# Patient Record
Sex: Male | Born: 1965
Health system: Southern US, Community
[De-identification: ages and names within clinical notes are randomized; demographics above are authoritative.]

## PROBLEM LIST (undated history)

## (undated) DIAGNOSIS — K649 Unspecified hemorrhoids: Secondary | ICD-10-CM

## (undated) DIAGNOSIS — G8929 Other chronic pain: Secondary | ICD-10-CM

## (undated) DIAGNOSIS — M545 Low back pain, unspecified: Secondary | ICD-10-CM

## (undated) DIAGNOSIS — J309 Allergic rhinitis, unspecified: Secondary | ICD-10-CM

## (undated) DIAGNOSIS — Z8614 Personal history of Methicillin resistant Staphylococcus aureus infection: Secondary | ICD-10-CM

## (undated) DIAGNOSIS — K625 Hemorrhage of anus and rectum: Secondary | ICD-10-CM

## (undated) DIAGNOSIS — G4733 Obstructive sleep apnea (adult) (pediatric): Secondary | ICD-10-CM

## (undated) DIAGNOSIS — F419 Anxiety disorder, unspecified: Secondary | ICD-10-CM

## (undated) DIAGNOSIS — J329 Chronic sinusitis, unspecified: Secondary | ICD-10-CM

## (undated) HISTORY — PX: VASECTOMY: SHX75

## (undated) HISTORY — DX: Hemorrhage of anus and rectum: K62.5

## (undated) HISTORY — DX: Obstructive sleep apnea (adult) (pediatric): G47.33

## (undated) HISTORY — PX: COLONOSCOPY: SHX174

## (undated) HISTORY — PX: MOLE REMOVAL: SHX2046

## (undated) HISTORY — DX: Low back pain, unspecified: M54.50

## (undated) HISTORY — DX: Allergic rhinitis, unspecified: J30.9

## (undated) HISTORY — DX: Low back pain: M54.5

## (undated) HISTORY — DX: Chronic sinusitis, unspecified: J32.9

## (undated) HISTORY — DX: Anxiety disorder, unspecified: F41.9

## (undated) HISTORY — DX: Other chronic pain: G89.29

## (undated) HISTORY — DX: Personal history of Methicillin resistant Staphylococcus aureus infection: Z86.14

---

## 2013-05-30 ENCOUNTER — Encounter: Payer: Self-pay | Admitting: Cardiology

## 2013-05-30 ENCOUNTER — Encounter: Payer: Self-pay | Admitting: *Deleted

## 2013-06-07 ENCOUNTER — Encounter: Payer: Self-pay | Admitting: Cardiology

## 2013-06-07 ENCOUNTER — Ambulatory Visit (INDEPENDENT_AMBULATORY_CARE_PROVIDER_SITE_OTHER): Payer: BC Managed Care – PPO | Admitting: Cardiology

## 2013-06-07 VITALS — BP 140/80 | HR 68 | Ht 66.0 in | Wt 199.0 lb

## 2013-06-07 DIAGNOSIS — R5381 Other malaise: Secondary | ICD-10-CM

## 2013-06-07 DIAGNOSIS — R635 Abnormal weight gain: Secondary | ICD-10-CM

## 2013-06-07 DIAGNOSIS — G4733 Obstructive sleep apnea (adult) (pediatric): Secondary | ICD-10-CM | POA: Insufficient documentation

## 2013-06-07 DIAGNOSIS — E669 Obesity, unspecified: Secondary | ICD-10-CM | POA: Insufficient documentation

## 2013-06-07 DIAGNOSIS — R5383 Other fatigue: Secondary | ICD-10-CM

## 2013-06-07 LAB — CBC
RBC: 4.88 Mil/uL (ref 4.22–5.81)
RDW: 13.7 % (ref 11.5–14.6)
WBC: 6.3 10*3/uL (ref 4.5–10.5)

## 2013-06-07 NOTE — Patient Instructions (Signed)
Your physician recommends that you continue on your current medications as directed. Please refer to the Current Medication list given to you today.  Your physician has requested that you have an exercise tolerance test. For further information please visit [email or web address] Please also follow instruction sheet, as given.   Please go to the lab and have your CBC and TSH drawn  Your physician recommends that you schedule a follow-up appointment in: 6 Months with Dr. Mayford Knife

## 2013-06-07 NOTE — Progress Notes (Signed)
  [address] [place], [place]  [address] Phone: [phone] Fax:  [phone]  Date:  06/07/2013   ID:  Steven Kirby, DOB 04-Jun-1966, MRN [id number]  PCP:  No primary provider on file.  Sleep Medicine MD:  Armanda Magic, MD     History of Present Illness: Steven Kirby is a 47 y.o. male with a history of OSA and obesity who presents for followup today.  He tolerates his CPAP but he takes it off in his sleep.  He uses a full face mask.  Over the past 3-4 months he has felt weak and has had increased weight gain.  He has noticed increased SOB but no chest pain.  He occasionally he notices some racing of his heart but it is usually when he is doing exertional activity.  He does not feel rested when he gets up in the am.  He does not fall asleep during the day but sometimes has a hard time staying awake during the day.  Wt Readings from Last 3 Encounters:  No data found for Wt     Past Medical History  Diagnosis Date  . Allergic rhinitis   . OSA (obstructive sleep apnea)   . Hemorrhage of rectum and anus   . History of MRSA infection   . Anxiety   . Chronic sinusitis   . Chronic low back pain   . Anxiety     No current outpatient prescriptions on file.   No current facility-administered medications for this visit.    Allergies:   Not on File  Social History:  The patient  reports that he has never smoked. He does not have any smokeless tobacco history on file. He reports that he does not drink alcohol or use illicit drugs.   Family History:  The patient's family history includes Colon cancer in an other family member; Diverticulitis in an other family member; Hyperlipidemia in an other family member; Hypertension in an other family member.   ROS:  Please see the history of present illness.      All other systems reviewed and negative.   PHYSICAL EXAM: VS:  There were no vitals taken for this visit. Well nourished, well developed, in no acute distress HEENT:  normal Neck: no JVD Cardiac:  normal S1, S2; RRR; no murmur Lungs:  clear to auscultation bilaterally, no wheezing, rhonchi or rales Abd: soft, nontender, no hepatomegaly Ext: no edema Skin: warm and dry Neuro:  CNs 2-12 intact, no focal abnormalities noted      ASSESSMENT AND PLAN:  1. Obstructive sleep apnea on CPAP therapy which he tolerates well but is having some problems keeping the mask on.  He is having some daytime sleepiness and nonrestorative sleep.  - I will get a CPAP download from his DME to see if his AHI is elevated  2. Obesity  - encouraged patient to continue with his exercise and diet 3.  Weight gain associated with fatigue ? Thyroid issues  - check TSH/CBC  - I have recommended an ETT to rule out ischemia due to weakness before starting exercise program 4.  Obesity - he tries to walk 15-30 minutes 3 times weekly.  He eats fast food for lunch.    - I have encouraged him to increase his walking to 45-60 minutes daily if stress test is normal  and consider weight watchers   Followup in 6 months  Signed, Armanda Magic, MD 06/07/2013 1:30 PM

## 2013-06-09 ENCOUNTER — Telehealth: Payer: Self-pay | Admitting: General Surgery

## 2013-06-09 NOTE — Telephone Encounter (Signed)
Message copied by Nita Sells on Wed Jun 09, 2013  9:36 AM ------      Message from: Armanda Magic R      Created: Tue Jun 08, 2013  2:10 PM       Please let patient know that labs were normal.  Continue current medical therapy. ------

## 2013-06-09 NOTE — Telephone Encounter (Signed)
PT is aware of the results.  

## 2013-07-08 ENCOUNTER — Encounter: Payer: BC Managed Care – PPO | Admitting: Cardiology

## 2015-03-10 ENCOUNTER — Emergency Department (HOSPITAL_COMMUNITY)
Admission: EM | Admit: 2015-03-10 | Discharge: 2015-03-10 | Disposition: A | Discharge status: Home / Self Care | Payer: BLUE CROSS/BLUE SHIELD | Attending: Emergency Medicine | Admitting: Emergency Medicine

## 2015-03-10 ENCOUNTER — Encounter (HOSPITAL_COMMUNITY): Payer: Self-pay | Admitting: Vascular Surgery

## 2015-03-10 ENCOUNTER — Emergency Department (HOSPITAL_COMMUNITY): Payer: BLUE CROSS/BLUE SHIELD

## 2015-03-10 DIAGNOSIS — Z8719 Personal history of other diseases of the digestive system: Secondary | ICD-10-CM | POA: Insufficient documentation

## 2015-03-10 DIAGNOSIS — G8929 Other chronic pain: Secondary | ICD-10-CM | POA: Insufficient documentation

## 2015-03-10 DIAGNOSIS — Z8659 Personal history of other mental and behavioral disorders: Secondary | ICD-10-CM | POA: Diagnosis not present

## 2015-03-10 DIAGNOSIS — Z8614 Personal history of Methicillin resistant Staphylococcus aureus infection: Secondary | ICD-10-CM | POA: Diagnosis not present

## 2015-03-10 DIAGNOSIS — M6281 Muscle weakness (generalized): Secondary | ICD-10-CM | POA: Insufficient documentation

## 2015-03-10 DIAGNOSIS — R269 Unspecified abnormalities of gait and mobility: Secondary | ICD-10-CM | POA: Diagnosis not present

## 2015-03-10 DIAGNOSIS — M545 Low back pain: Secondary | ICD-10-CM | POA: Diagnosis present

## 2015-03-10 DIAGNOSIS — M5441 Lumbago with sciatica, right side: Secondary | ICD-10-CM | POA: Insufficient documentation

## 2015-03-10 DIAGNOSIS — R29898 Other symptoms and signs involving the musculoskeletal system: Secondary | ICD-10-CM

## 2015-03-10 HISTORY — DX: Unspecified hemorrhoids: K64.9

## 2015-03-10 LAB — CBC WITH DIFFERENTIAL/PLATELET
BASOS ABS: 0 10*3/uL (ref 0.0–0.1)
BASOS PCT: 0 % (ref 0–1)
EOS ABS: 0 10*3/uL (ref 0.0–0.7)
EOS PCT: 0 % (ref 0–5)
HEMATOCRIT: 41.8 % (ref 39.0–52.0)
Hemoglobin: 14.1 g/dL (ref 13.0–17.0)
Lymphocytes Relative: 5 % — ABNORMAL LOW (ref 12–46)
Lymphs Abs: 0.9 10*3/uL (ref 0.7–4.0)
MCH: 28 pg (ref 26.0–34.0)
MCHC: 33.7 g/dL (ref 30.0–36.0)
MCV: 82.9 fL (ref 78.0–100.0)
Monocytes Absolute: 0.5 10*3/uL (ref 0.1–1.0)
Monocytes Relative: 3 % (ref 3–12)
Neutro Abs: 15.5 10*3/uL — ABNORMAL HIGH (ref 1.7–7.7)
Neutrophils Relative %: 92 % — ABNORMAL HIGH (ref 43–77)
Platelets: 272 10*3/uL (ref 150–400)
RBC: 5.04 MIL/uL (ref 4.22–5.81)
RDW: 12.8 % (ref 11.5–15.5)
WBC: 16.9 10*3/uL — ABNORMAL HIGH (ref 4.0–10.5)

## 2015-03-10 LAB — BASIC METABOLIC PANEL
ANION GAP: 8 (ref 5–15)
BUN: 14 mg/dL (ref 6–20)
CO2: 26 mmol/L (ref 22–32)
Calcium: 9.5 mg/dL (ref 8.9–10.3)
Chloride: 104 mmol/L (ref 101–111)
Creatinine, Ser: 1.12 mg/dL (ref 0.61–1.24)
GFR calc non Af Amer: >60 mL/min (ref >60)
Glucose, Bld: 156 mg/dL — ABNORMAL HIGH (ref 65–99)
Potassium: 4.1 mmol/L (ref 3.5–5.1)
Sodium: 138 mmol/L (ref 135–145)

## 2015-03-10 MED ORDER — SODIUM CHLORIDE 0.9 % IV BOLUS (SEPSIS)
1000.0000 mL | Freq: Once | INTRAVENOUS | Status: AC
  Administered 2015-03-10: 1000 mL via INTRAVENOUS

## 2015-03-10 MED ORDER — METHOCARBAMOL 500 MG PO TABS
1000.0000 mg | ORAL_TABLET | Freq: Once | ORAL | Status: AC
  Administered 2015-03-10: 1000 mg via ORAL
  Filled 2015-03-10: qty 2

## 2015-03-10 MED ORDER — HYDROMORPHONE HCL 1 MG/ML IJ SOLN
1.0000 mg | Freq: Once | INTRAMUSCULAR | Status: AC
  Administered 2015-03-10: 1 mg via INTRAVENOUS
  Filled 2015-03-10: qty 1

## 2015-03-10 MED ORDER — METHOCARBAMOL 500 MG PO TABS: 1000.0000 mg | ORAL_TABLET | Freq: Two times a day (BID) | ORAL | Status: DC

## 2015-03-10 MED ORDER — NAPROXEN 500 MG PO TABS: 500.0000 mg | ORAL_TABLET | Freq: Two times a day (BID) | ORAL | Status: DC

## 2015-03-10 MED ORDER — DEXAMETHASONE SODIUM PHOSPHATE 10 MG/ML IJ SOLN
10.0000 mg | Freq: Once | INTRAMUSCULAR | Status: AC
  Administered 2015-03-10: 10 mg via INTRAVENOUS
  Filled 2015-03-10: qty 1

## 2015-03-10 MED ORDER — OXYCODONE-ACETAMINOPHEN 5-325 MG PO TABS
2.0000 | ORAL_TABLET | Freq: Once | ORAL | Status: AC
Start: 2015-03-10 — End: 2015-03-10
  Administered 2015-03-10: 2 via ORAL
  Filled 2015-03-10: qty 2

## 2015-03-10 MED ORDER — OXYCODONE-ACETAMINOPHEN 7.5-325 MG PO TABS: 1.0000 | ORAL_TABLET | ORAL | Status: DC | PRN

## 2015-03-10 MED ORDER — DIAZEPAM 5 MG/ML IJ SOLN
5.0000 mg | Freq: Once | INTRAMUSCULAR | Status: AC
  Administered 2015-03-10: 5 mg via INTRAVENOUS
  Filled 2015-03-10: qty 2

## 2015-03-10 NOTE — ED Notes (Signed)
Pt reports to the ED for eval low back pain that radiates into his right leg. He has had this pain for several days since he lifted a 50-60 lb log on Sunday 03/05/15. He went to go see his PCP and was given Norco and Prednisone and told to have an MRI. HE went for an MRI today but was unable to sit at the imaging center. He had 1 Norco approx 15 mins ago. He reports he has had increased difficulty walking as well. Denies any tingling, paralysis, or bowel or bladder changes but does report some right leg numbness. Pt A&Ox4, resp e/u, and skin warm and dry.

## 2015-03-10 NOTE — ED Provider Notes (Signed)
Care assumed from Dr. Reather Converse.  Steven Kirby is a 49 y.o. male presents from outpatient imaging center for worsening back pain that radiates down the right leg for several days after lifting a heavy load on 03/05/15.  He has been taking norco and prednisone without relief.  On initial exam by Dr. Reather Converse. pt with mild weakness of flexion of the right hip.  He is without saddle anesthesia, loss of bowel or bladder control.     Physical Exam  BP 112/70 mmHg  Pulse 81  Temp(Src) 98.7 F (37.1 C) (Oral)  Resp 18  Ht 5\' 6"  (1.676 m)  Wt 200 lb (90.719 kg)  BMI 32.30 kg/m2  SpO2 97%  Physical Exam   Face to face Exam:   General: Awake  HEENT: Atraumatic  Resp: Normal effort  Abd: Nondistended  Neuro: Mild weakness with flexion of the right hip; 3/5 in the right hip  Results for orders placed or performed during the hospital encounter of 03/10/15  CBC with Differential/Platelet  Result Value Ref Range   WBC 16.9 (H) 4.0 - 10.5 K/uL   RBC 5.04 4.22 - 5.81 MIL/uL   Hemoglobin 14.1 13.0 - 17.0 g/dL   HCT 41.8 39.0 - 52.0 %   MCV 82.9 78.0 - 100.0 fL   MCH 28.0 26.0 - 34.0 pg   MCHC 33.7 30.0 - 36.0 g/dL   RDW 12.8 11.5 - 15.5 %   Platelets 272 150 - 400 K/uL   Neutrophils Relative % 92 (H) 43 - 77 %   Neutro Abs 15.5 (H) 1.7 - 7.7 K/uL   Lymphocytes Relative 5 (L) 12 - 46 %   Lymphs Abs 0.9 0.7 - 4.0 K/uL   Monocytes Relative 3 3 - 12 %   Monocytes Absolute 0.5 0.1 - 1.0 K/uL   Eosinophils Relative 0 0 - 5 %   Eosinophils Absolute 0.0 0.0 - 0.7 K/uL   Basophils Relative 0 0 - 1 %   Basophils Absolute 0.0 0.0 - 0.1 K/uL  Basic metabolic panel  Result Value Ref Range   Sodium 138 135 - 145 mmol/L   Potassium 4.1 3.5 - 5.1 mmol/L   Chloride 104 101 - 111 mmol/L   CO2 26 22 - 32 mmol/L   Glucose, Bld 156 (H) 65 - 99 mg/dL   BUN 14 6 - 20 mg/dL   Creatinine, Ser 1.12 0.61 - 1.24 mg/dL   Calcium 9.5 8.9 - 10.3 mg/dL   GFR calc non Af Amer >60 >60 mL/min   GFR calc Af Amer >60  >60 mL/min   Anion gap 8 5 - 15   Mr Thoracic Spine Wo Contrast  03/10/2015   : CLINICAL DATA: Right leg weakness. Low back pain  EXAM: MRI THORACIC AND LUMBAR SPINE WITHOUT CONTRAST  TECHNIQUE: Multiplanar and multiecho pulse sequences of the thoracic and lumbar spine were obtained without intravenous contrast.  COMPARISON: None.  FINDINGS: MR THORACIC SPINE FINDINGS  Negative for thoracic spine fracture or mass. No bone marrow edema. Spinal cord signal normal. No cord compression or cord mass.  Thoracic disc degeneration. Abnormal disc spaces are described below  T5-6: Small right paracentral disc protrusion without stenosis  T6-7: Mild disc degeneration without stenosis. Small right-sided disc protrusion.  T10-11: Small to moderate right-sided disc protrusion with mild flattening of the cord. No cord compression  T11-12: Small central disc protrusion without stenosis.  MR LUMBAR SPINE FINDINGS  Normal lumbar alignment. Negative for fracture or mass lesion. Conus medullaris  normal and terminates at mid L1  L1-2: Small right foraminal disc protrusion. No compression of the right L1 nerve root. No significant spinal stenosis  L2-3: Mild disc degeneration and diffuse bulging of the disc without significant spinal stenosis.  L3-4: Moderate disc degeneration. Fatty changes in the endplates right-greater-than-left with associated disc space narrowing. Small right lateral disc protrusion with displacement of the right L3 nerve root. Mild facet degeneration. Mild narrowing of the canal.  L4-5: Moderately large right lateral disc protrusion with potential for compression of the right L4 nerve root in the foramen and lateral to the foramen. Mild facet degeneration without spinal stenosis  L5-S1: Small central disc protrusion without neural impingement  IMPRESSION: MR THORACIC SPINE IMPRESSION  Small central disc protrusions T5-6 and T6-7.  Small moderate right-sided disc protrusion T10-11  Small central disc protrusion  T11-12  MR LUMBAR SPINE IMPRESSION  Small right foraminal disc protrusion L1-2  Small right lateral disc protrusion L3-4  Moderately large right lateral disc protrusion L4-5 with likely compression of the right L4 nerve root in the foramen. This may be a cause of the patient's right leg weakness.   Electronically Signed   By: Franchot Gallo M.D.   On: 03/10/2015 18:46   Mr Lumbar Spine Wo Contrast  03/10/2015   CLINICAL DATA:  Right leg weakness.  Low back pain  EXAM: MRI THORACIC AND LUMBAR SPINE WITHOUT CONTRAST  TECHNIQUE: Multiplanar and multiecho pulse sequences of the thoracic and lumbar spine were obtained without intravenous contrast.  COMPARISON:  None.  FINDINGS: MR THORACIC SPINE FINDINGS  Negative for thoracic spine fracture or mass. No bone marrow edema. Spinal cord signal normal. No cord compression or cord mass.  Thoracic disc degeneration. Abnormal disc spaces are described below  T5-6:  Small right paracentral disc protrusion without stenosis  T6-7: Mild disc degeneration without stenosis. Small right-sided disc protrusion.  T10-11: Small to moderate right-sided disc protrusion with mild flattening of the cord. No cord compression  T11-12: Small central disc protrusion without stenosis.  MR LUMBAR SPINE FINDINGS  Normal lumbar alignment. Negative for fracture or mass lesion. Conus medullaris normal and terminates at mid L1  L1-2: Small right foraminal disc protrusion. No compression of the right L1 nerve root. No significant spinal stenosis  L2-3: Mild disc degeneration and diffuse bulging of the disc without significant spinal stenosis.  L3-4: Moderate disc degeneration. Fatty changes in the endplates right-greater-than-left with associated disc space narrowing. Small right lateral disc protrusion with displacement of the right L3 nerve root. Mild facet degeneration. Mild narrowing of the canal.  L4-5: Moderately large right lateral disc protrusion with potential for compression of the right L4  nerve root in the foramen and lateral to the foramen. Mild facet degeneration without spinal stenosis  L5-S1:  Small central disc protrusion without neural impingement  IMPRESSION: MR THORACIC SPINE IMPRESSION  Small central disc protrusions T5-6 and T6-7.  Small moderate right-sided disc protrusion T10-11  Small central disc protrusion T11-12  MR LUMBAR SPINE IMPRESSION  Small right foraminal disc protrusion L1-2  Small right lateral disc protrusion L3-4  Moderately large right lateral disc protrusion L4-5 with likely compression of the right L4 nerve root in the foramen. This may be a cause of the patient's right leg weakness.   Electronically Signed   By: Franchot Gallo M.D.   On: 03/10/2015 18:38      ED Course  Procedures  1. Right-sided low back pain with right-sided sciatica   2. Right  leg weakness     MDM  Steven Kirby presents with worsening back pain and inability to tolerate outpatient MRI.    Plan:  MRI pending.  If no acute abnormality will plan for d/c home with close PCP f/u.    7:06 PM Patient reports that his pain has returned. He rated at severe. He reports he was unable to walk due to pain.    9:06 PM Pt with pain under control.  He ambulates with antalgic gait but no foot drop.  Recommend f/u with neurosurgery ASAP.  BP 112/70 mmHg  Pulse 81  Temp(Src) 98.7 F (37.1 C) (Oral)  Resp 18  Ht 5\' 6"  (1.676 m)  Wt 200 lb (90.719 kg)  BMI 32.30 kg/m2  SpO2 97%   Abigail Butts, PA-C 03/10/15 2118  Pamella Pert, MD 03/11/15 1601

## 2015-03-10 NOTE — Discharge Instructions (Signed)
1. Medications: robaxin, percocet, naprosyn, usual home medications 2. Treatment: rest, drink plenty of fluids, alternate ice and heat 3. Follow Up: Please followup with neurosurgery in 3 days for discussion of your diagnoses and further evaluation after today's visit; if you do not have a primary care doctor use the resource guide provided to find one;  Return to the ER for worsening back pain, difficulty walking, loss of bowel or bladder control or other concerning symptoms

## 2015-03-10 NOTE — ED Provider Notes (Signed)
CSN: [id number]     Arrival date & time 03/10/15  1346 History   First MD Initiated Contact with Patient 03/10/15 1349     Chief Complaint  Patient presents with  . Back Pain     (Consider location/radiation/quality/duration/timing/severity/associated sxs/prior Treatment) HPI Comments: 49 year old male with history of obesity, sleep apnea, nonsmoker presents with worsening right lower back pain rating down the right leg and right leg weakness. Patient was at MRI however unable to obtain due to worsening pain. Patient has never had back pain is severe in the past. Patient had milder episode as a teenager. No bowel or bladder changes, no active cancer, no direct injury however did start after lifting a heavy log. No abdominal pain. Worse with any movement and walking. Started on Sunday then improved however started were significantly earlier today. No significant back surgery history  Patient is a 49 y.o. male presenting with back pain. The history is provided by the patient.  Back Pain Associated symptoms: weakness   Associated symptoms: no abdominal pain, no chest pain, no dysuria, no fever and no headaches     Past Medical History  Diagnosis Date  . Allergic rhinitis   . OSA (obstructive sleep apnea)   . Hemorrhage of rectum and anus   . History of MRSA infection   . Anxiety   . Chronic sinusitis   . Chronic low back pain   . Anxiety   . Hemorrhoids    Past Surgical History  Procedure Laterality Date  . Vasectomy    . Mole removal    . Colonoscopy     Family History  Problem Relation Age of Onset  . Diverticulitis      without polys  . Hypertension    . Hyperlipidemia    . Colon cancer    . Hypertension Father    History  Substance Use Topics  . Smoking status: Never Smoker   . Smokeless tobacco: Not on file  . Alcohol Use: No    Review of Systems  Constitutional: Negative for fever and chills.  HENT: Negative for congestion.   Eyes: Negative for visual  disturbance.  Respiratory: Negative for shortness of breath.   Cardiovascular: Negative for chest pain.  Gastrointestinal: Negative for vomiting and abdominal pain.  Genitourinary: Negative for dysuria and flank pain.  Musculoskeletal: Positive for back pain and gait problem. Negative for neck pain and neck stiffness.  Skin: Negative for rash.  Neurological: Positive for weakness. Negative for light-headedness and headaches.      Allergies  Review of patient's allergies indicates no known allergies.  Home Medications   Prior to Admission medications   Medication Sig Start Date End Date Taking? Authorizing Provider  methocarbamol (ROBAXIN) 500 MG tablet Take 2 tablets (1,000 mg total) by mouth 2 (two) times daily. 03/10/15   Hannah Muthersbaugh, PA-C  naproxen (NAPROSYN) 500 MG tablet Take 1 tablet (500 mg total) by mouth 2 (two) times daily with a meal. 03/10/15   Hannah Muthersbaugh, PA-C  oxyCODONE-acetaminophen (PERCOCET) 7.5-325 MG per tablet Take 1-2 tablets by mouth every 4 (four) hours as needed for severe pain. 03/10/15   Hannah Muthersbaugh, PA-C   BP 112/70 mmHg  Pulse 81  Temp(Src) 98.7 F (37.1 C) (Oral)  Resp 18  Ht 5\' 6"  (1.676 m)  Wt 200 lb (90.719 kg)  BMI 32.30 kg/m2  SpO2 97% Physical Exam  Constitutional: He is oriented to person, place, and time. He appears well-developed and well-nourished.  HENT:  Head: Normocephalic  and atraumatic.  Eyes: Conjunctivae are normal. Right eye exhibits no discharge. Left eye exhibits no discharge.  Neck: Normal range of motion. Neck supple. No tracheal deviation present.  Cardiovascular: Normal rate and regular rhythm.   Pulmonary/Chest: Effort normal and breath sounds normal.  Abdominal: Soft. He exhibits no distension. There is no tenderness. There is no guarding.  Musculoskeletal: He exhibits tenderness (right buttocks right paraspinal lumbar). He exhibits no edema.  Neurological: He is alert and oriented to person, place,  and time.  Reflex Scores:      Patellar reflexes are 3+ on the right side and 3+ on the left side.      Achilles reflexes are 2+ on the right side and 2+ on the left side. Patient has 5+ strength with knee flexion extension bilateral, great toe flexion extension, plantar flexion however patient has 4+ strength with hip flexion on the right. Sensation major nerves intact bilateral  Skin: Skin is warm. No rash noted.  Psychiatric: He has a normal mood and affect.  Nursing note and vitals reviewed.   ED Course  Procedures (including critical care time) Labs Review Labs Reviewed  CBC WITH DIFFERENTIAL/PLATELET - Abnormal; Notable for the following:    WBC 16.9 (*)    Neutrophils Relative % 92 (*)    Neutro Abs 15.5 (*)    Lymphocytes Relative 5 (*)    All other components within normal limits  BASIC METABOLIC PANEL - Abnormal; Notable for the following:    Glucose, Bld 156 (*)    All other components within normal limits    Imaging Review No results found.   EKG Interpretation None      MDM   Final diagnoses:  Right leg weakness  Right-sided low back pain with right-sided sciatica   Patient presented with worsening right lower back pain rating down the right leg with mild weakness. Plan for pain meds, muscle relaxants, basic blood work and MRI thoracic and lumbar look for disc herniation or other cause of his new and worsening symptoms.Pt care signed out to fup MRI results.   Right-sided low back pain with right-sided sciatica  Right leg weakness     Elnora Morrison, MD 03/14/15 2245

## 2015-03-14 ENCOUNTER — Other Ambulatory Visit: Payer: Self-pay | Admitting: Neurosurgery

## 2015-03-15 NOTE — Pre-Procedure Instructions (Signed)
Steven Kirby  03/15/2015      [facility] [address], [place] - [address] [name] [address] Phone: [phone] Fax: [phone]    Your procedure is scheduled on Friday, March 17, 2015  Report to [facility] Admitting at 10:00 A.M.  Call this number if you have problems the morning of surgery:  [phone]   Remember:  Do not eat food or drink liquids after midnight.  Take these medicines the morning of surgery with A SIP OF Water: if needed:  oxyCODONE-acetaminophen (PERCOCET)  Stop taking Aspirin, vitamins, and herbal medications. Do not take any NSAIDs ie: Ibuprofen, Advil, Naproxen or any medication containing Aspirin; stop now.   Do not wear jewelry, make-up or nail polish.  Do not wear lotions, powders, or perfumes.  You may not wear deodorant.  Do not shave 48 hours prior to surgery.  Men may shave face and neck.  Do not bring valuables to the hospital.  [facility] is not responsible for any belongings or valuables.  Contacts, dentures or bridgework may not be worn into surgery.  Leave your suitcase in the car.  After surgery it may be brought to your room.  For patients admitted to the hospital, discharge time will be determined by your treatment team.  Patients discharged the day of surgery will not be allowed to drive home.   Name and phone number of your driver:   Special instructions:  Special Instructions:Special Instructions: [facility] - Preparing for Surgery  Before surgery, you can play an important role.  Because skin is not sterile, your skin needs to be as free of germs as possible.  You can reduce the number of germs on you skin by washing with CHG (chlorahexidine gluconate) soap before surgery.  CHG is an antiseptic cleaner which kills germs and bonds with the skin to continue killing germs even after washing.  Please DO NOT use if you have an allergy to CHG or antibacterial soaps.  If your skin becomes  reddened/irritated stop using the CHG and inform your nurse when you arrive at Short Stay.  Do not shave (including legs and underarms) for at least 48 hours prior to the first CHG shower.  You may shave your face.  Please follow these instructions carefully:   1.  Shower with CHG Soap the night before surgery and the morning of Surgery.  2.  If you choose to wash your hair, wash your hair first as usual with your normal shampoo.  3.  After you shampoo, rinse your hair and body thoroughly to remove the Shampoo.  4.  Use CHG as you would any other liquid soap.  You can apply chg directly  to the skin and wash gently with scrungie or a clean washcloth.  5.  Apply the CHG Soap to your body ONLY FROM THE NECK DOWN.  Do not use on open wounds or open sores.  Avoid contact with your eyes, ears, mouth and genitals (private parts).  Wash genitals (private parts) with your normal soap.  6.  Wash thoroughly, paying special attention to the area where your surgery will be performed.  7.  Thoroughly rinse your body with warm water from the neck down.  8.  DO NOT shower/wash with your normal soap after using and rinsing off the CHG Soap.  9.  Pat yourself dry with a clean towel.            10.  Wear clean pajamas.            11.  Place clean sheets on your bed the night of your first shower and do not sleep with pets.  Day of Surgery  Do not apply any lotions/deodorants the morning of surgery.  Please wear clean clothes to the hospital/surgery center.  Please read over the following fact sheets that you were given. Pain Booklet, Coughing and Deep Breathing, MRSA Information and Surgical Site Infection Prevention

## 2015-03-16 ENCOUNTER — Encounter (HOSPITAL_COMMUNITY)
Admission: RE | Admit: 2015-03-16 | Discharge: 2015-03-16 | Disposition: A | Discharge status: Home / Self Care | Payer: BLUE CROSS/BLUE SHIELD | Source: Ambulatory Visit | Attending: Neurosurgery | Admitting: Neurosurgery

## 2015-03-16 ENCOUNTER — Encounter (HOSPITAL_COMMUNITY): Payer: Self-pay | Admitting: *Deleted

## 2015-03-16 DIAGNOSIS — M5126 Other intervertebral disc displacement, lumbar region: Secondary | ICD-10-CM | POA: Diagnosis present

## 2015-03-16 DIAGNOSIS — G4733 Obstructive sleep apnea (adult) (pediatric): Secondary | ICD-10-CM | POA: Diagnosis not present

## 2015-03-16 DIAGNOSIS — E669 Obesity, unspecified: Secondary | ICD-10-CM | POA: Diagnosis not present

## 2015-03-16 LAB — CBC
HCT: 41.4 % (ref 39.0–52.0)
HEMOGLOBIN: 13.9 g/dL (ref 13.0–17.0)
MCH: 28 pg (ref 26.0–34.0)
MCHC: 33.6 g/dL (ref 30.0–36.0)
MCV: 83.3 fL (ref 78.0–100.0)
Platelets: 271 10*3/uL (ref 150–400)
RBC: 4.97 MIL/uL (ref 4.22–5.81)
RDW: 13 % (ref 11.5–15.5)
WBC: 10.9 10*3/uL — ABNORMAL HIGH (ref 4.0–10.5)

## 2015-03-16 LAB — SURGICAL PCR SCREEN
MRSA, PCR: NEGATIVE
STAPHYLOCOCCUS AUREUS: NEGATIVE

## 2015-03-16 NOTE — Progress Notes (Signed)
Anesthesia Chart Review:  Patient is a 49 year old male scheduled for right L4-5 far lateral diskectomy on 03/17/15 by Dr. Christella Noa. PAT was this morning. DX: lumbar herniated disc.  He has progressive back pain and RLE weakness.  History includes non-smoker, OSA (no longer on CPAP), MRSA, chronic sinusitis, chronic back pain, anxiety. PCP is Dr. Maury Dus. He has seen cardiologist Dr. Fransico Him in the past for OSA.  His last visit with her was on 06/07/13. At that time, he reported feeling weak with increased SOB and weight gain for the past 3-4 months. There was no chest pain.  She recommended an ETT to rule out ischemia due to weakness.  It appears test was never done.  Patient also denied remembering ever having a stress test.   03/10/15 MRI L/T spine: IMPRESSION: MR THORACIC SPINE IMPRESSION Small central disc protrusions T5-6 and T6-7. Small moderate right-sided disc protrusion T10-11 Small central disc protrusion T11-12 MR LUMBAR SPINE IMPRESSION Small right foraminal disc protrusion L1-2 Small right lateral disc protrusion L3-4 Moderately large right lateral disc protrusion L4-5 with likely compression of the right L4 nerve root in the foramen. This may be a cause of the patient's right leg weakness.  03/10/15 labs noted. Non-fasting glucose 156. CBC rechecked today due to prior WBC of 16.9K.  Today's WBC is down tno 10.9K.   Discussed with anesthesiologist Dr. Tamala Julian. Recommend pre-operative EKG. Will also need to talk with patient to discuss if he is experiencing any angina or angina-equivalent symptoms. I was able to reach patient at [phone]. 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 He doesn't really recall Dr. Radford Pax ordering an ETT. He did not have one done.  He doesn't really recall the symptoms documented in her note.  He said that in general he is more tired since being diagnosed with OSA, but there have been not  recent changes in his energy level.  He does not feel particularly weak other than his symptoms related to his ruptured disc. Within the past few months, prior to his worsening back pain and RLE weakness, he was able to walk for at least 30 minutes and bail hay.  There is a [place] hill on his property that he has exertional dyspnea when climbing, but this has been for years and he says that nearly everyone he knows is not able to climb that hill without becoming SOB. He denied chest pain, edema, palpitations, pre-syncope/syncope.  He denied history of known CAD/MI and DM. There is a family history of CAD, but > 35 years old. He is no longer using CPAP since he would pull it off during his sleep.     I updated Dr. Tamala Julian.  Unless there is a change in his symptomology then it is anticipated that he can proceed as planned if his EKG dose not show any worrisome findings.  There is a comparison EKG in Epic if needed.   [facility] Short Stay Center/Anesthesiology Phone [phone] 03/16/2015 2:17 PM

## 2015-03-16 NOTE — Progress Notes (Signed)
Spoke with [name] about case. Mr. Steven Kirby does not remember having a stress test, but notes from Dr Radford Pax read that one was ordered. He denies having any cardiac issues. [name] to review chart and let me know if further test are needed.

## 2015-03-16 NOTE — Progress Notes (Addendum)
PCP is Dr Maury Dus States he saw a cardiologist(Dr Turner) years ago when he had a sleep study done, but it was just because of the sleep study. States that he doesn't wear the cpap any more.  Denies having a card cath or echo. Doesn't remember ever having a stress test.

## 2015-03-17 ENCOUNTER — Ambulatory Visit (HOSPITAL_COMMUNITY): Payer: BLUE CROSS/BLUE SHIELD | Admitting: Vascular Surgery

## 2015-03-17 ENCOUNTER — Encounter (HOSPITAL_COMMUNITY): Payer: Self-pay | Admitting: Certified Registered Nurse Anesthetist

## 2015-03-17 ENCOUNTER — Ambulatory Visit (HOSPITAL_COMMUNITY)
Admission: RE | Admit: 2015-03-17 | Discharge: 2015-03-18 | Disposition: A | Discharge status: Home / Self Care | Payer: BLUE CROSS/BLUE SHIELD | Source: Ambulatory Visit | Attending: Neurosurgery | Admitting: Neurosurgery

## 2015-03-17 ENCOUNTER — Ambulatory Visit (HOSPITAL_COMMUNITY): Payer: BLUE CROSS/BLUE SHIELD | Admitting: Anesthesiology

## 2015-03-17 ENCOUNTER — Encounter (HOSPITAL_COMMUNITY)
Admission: RE | Disposition: A | Discharge status: Home / Self Care | Payer: Self-pay | Source: Ambulatory Visit | Attending: Neurosurgery

## 2015-03-17 ENCOUNTER — Ambulatory Visit (HOSPITAL_COMMUNITY): Payer: BLUE CROSS/BLUE SHIELD

## 2015-03-17 DIAGNOSIS — E669 Obesity, unspecified: Secondary | ICD-10-CM | POA: Insufficient documentation

## 2015-03-17 DIAGNOSIS — G4733 Obstructive sleep apnea (adult) (pediatric): Secondary | ICD-10-CM | POA: Diagnosis not present

## 2015-03-17 DIAGNOSIS — Z419 Encounter for procedure for purposes other than remedying health state, unspecified: Secondary | ICD-10-CM

## 2015-03-17 DIAGNOSIS — M5126 Other intervertebral disc displacement, lumbar region: Secondary | ICD-10-CM | POA: Diagnosis not present

## 2015-03-17 HISTORY — PX: LUMBAR LAMINECTOMY/DECOMPRESSION MICRODISCECTOMY: SHX5026

## 2015-03-17 SURGERY — LUMBAR LAMINECTOMY/DECOMPRESSION MICRODISCECTOMY 1 LEVEL
Anesthesia: General | Laterality: Right

## 2015-03-17 MED ORDER — MAGNESIUM CITRATE PO SOLN
1.0000 | Freq: Once | ORAL | Status: AC | PRN
  Filled 2015-03-17: qty 296

## 2015-03-17 MED ORDER — ACETAMINOPHEN 650 MG RE SUPP: 650.0000 mg | RECTAL | Status: DC | PRN

## 2015-03-17 MED ORDER — 0.9 % SODIUM CHLORIDE (POUR BTL) OPTIME
TOPICAL | Status: DC | PRN
  Administered 2015-03-17: 1000 mL

## 2015-03-17 MED ORDER — METHOCARBAMOL 500 MG PO TABS
ORAL_TABLET | ORAL | Status: AC
  Filled 2015-03-17: qty 2

## 2015-03-17 MED ORDER — MIDAZOLAM HCL 5 MG/5ML IJ SOLN
INTRAMUSCULAR | Status: DC | PRN
  Administered 2015-03-17: 2 mg via INTRAVENOUS

## 2015-03-17 MED ORDER — PROPOFOL 10 MG/ML IV BOLUS
INTRAVENOUS | Status: DC | PRN
  Administered 2015-03-17: 150 mg via INTRAVENOUS

## 2015-03-17 MED ORDER — GLYCOPYRROLATE 0.2 MG/ML IJ SOLN
INTRAMUSCULAR | Status: AC
  Filled 2015-03-17: qty 2

## 2015-03-17 MED ORDER — DEXAMETHASONE SODIUM PHOSPHATE 10 MG/ML IJ SOLN
INTRAMUSCULAR | Status: AC
Start: 2015-03-17 — End: 2015-03-17
  Filled 2015-03-17: qty 1

## 2015-03-17 MED ORDER — METHYLPREDNISOLONE ACETATE 80 MG/ML IJ SUSP
INTRAMUSCULAR | Status: DC | PRN
  Administered 2015-03-17: 80 mg

## 2015-03-17 MED ORDER — FENTANYL CITRATE (PF) 100 MCG/2ML IJ SOLN
INTRAMUSCULAR | Status: DC | PRN
  Administered 2015-03-17: 25 ug via INTRAVENOUS

## 2015-03-17 MED ORDER — MENTHOL 3 MG MT LOZG: 1.0000 | LOZENGE | OROMUCOSAL | Status: DC | PRN

## 2015-03-17 MED ORDER — CYCLOBENZAPRINE HCL 10 MG PO TABS: 10.0000 mg | ORAL_TABLET | Freq: Three times a day (TID) | ORAL | Status: DC | PRN

## 2015-03-17 MED ORDER — MORPHINE SULFATE 2 MG/ML IJ SOLN: 1.0000 mg | INTRAMUSCULAR | Status: DC | PRN

## 2015-03-17 MED ORDER — ROCURONIUM BROMIDE 100 MG/10ML IV SOLN
INTRAVENOUS | Status: DC | PRN
  Administered 2015-03-17: 50 mg via INTRAVENOUS

## 2015-03-17 MED ORDER — ONDANSETRON HCL 4 MG/2ML IJ SOLN
INTRAMUSCULAR | Status: DC | PRN
  Administered 2015-03-17: 4 mg via INTRAVENOUS

## 2015-03-17 MED ORDER — BISACODYL 5 MG PO TBEC
5.0000 mg | DELAYED_RELEASE_TABLET | Freq: Every day | ORAL | Status: DC | PRN
  Filled 2015-03-17: qty 1

## 2015-03-17 MED ORDER — LIDOCAINE-EPINEPHRINE 0.5 %-1:200000 IJ SOLN
INTRAMUSCULAR | Status: DC | PRN
  Administered 2015-03-17: 9 mL

## 2015-03-17 MED ORDER — ONDANSETRON HCL 4 MG/2ML IJ SOLN: 4.0000 mg | INTRAMUSCULAR | Status: DC | PRN

## 2015-03-17 MED ORDER — SODIUM CHLORIDE 0.9 % IJ SOLN
3.0000 mL | Freq: Two times a day (BID) | INTRAMUSCULAR | Status: DC
  Administered 2015-03-17: 3 mL via INTRAVENOUS

## 2015-03-17 MED ORDER — GLYCOPYRROLATE 0.2 MG/ML IJ SOLN
INTRAMUSCULAR | Status: DC | PRN
  Administered 2015-03-17: 0.4 mg via INTRAVENOUS

## 2015-03-17 MED ORDER — FENTANYL CITRATE (PF) 100 MCG/2ML IJ SOLN
INTRAMUSCULAR | Status: DC | PRN
  Administered 2015-03-17: 50 ug via INTRAVENOUS
  Administered 2015-03-17 (×2): 100 ug via INTRAVENOUS

## 2015-03-17 MED ORDER — SENNOSIDES-DOCUSATE SODIUM 8.6-50 MG PO TABS
1.0000 | ORAL_TABLET | Freq: Every evening | ORAL | Status: DC | PRN
  Filled 2015-03-17: qty 1

## 2015-03-17 MED ORDER — PHENOL 1.4 % MT LIQD: 1.0000 | OROMUCOSAL | Status: DC | PRN

## 2015-03-17 MED ORDER — PROMETHAZINE HCL 25 MG/ML IJ SOLN: 6.2500 mg | INTRAMUSCULAR | Status: DC | PRN

## 2015-03-17 MED ORDER — METHOCARBAMOL 500 MG PO TABS
1000.0000 mg | ORAL_TABLET | Freq: Two times a day (BID) | ORAL | Status: DC
  Administered 2015-03-17 – 2015-03-18 (×3): 1000 mg via ORAL
  Filled 2015-03-17 (×5): qty 2

## 2015-03-17 MED ORDER — HYDROCODONE-ACETAMINOPHEN 5-325 MG PO TABS: 1.0000 | ORAL_TABLET | Freq: Four times a day (QID) | ORAL | Status: DC | PRN

## 2015-03-17 MED ORDER — HEMOSTATIC AGENTS (NO CHARGE) OPTIME
TOPICAL | Status: DC | PRN
  Administered 2015-03-17: 1 via TOPICAL

## 2015-03-17 MED ORDER — ARTIFICIAL TEARS OP OINT
TOPICAL_OINTMENT | OPHTHALMIC | Status: AC
  Filled 2015-03-17: qty 3.5

## 2015-03-17 MED ORDER — THROMBIN 5000 UNITS EX SOLR
CUTANEOUS | Status: DC | PRN
  Administered 2015-03-17 (×2): 5000 [IU] via TOPICAL

## 2015-03-17 MED ORDER — CEFAZOLIN SODIUM-DEXTROSE 2-3 GM-% IV SOLR
INTRAVENOUS | Status: AC
  Filled 2015-03-17: qty 50

## 2015-03-17 MED ORDER — DEXAMETHASONE SODIUM PHOSPHATE 10 MG/ML IJ SOLN
INTRAMUSCULAR | Status: DC | PRN
  Administered 2015-03-17: 10 mg via INTRAVENOUS

## 2015-03-17 MED ORDER — ARTIFICIAL TEARS OP OINT
TOPICAL_OINTMENT | OPHTHALMIC | Status: DC | PRN
  Administered 2015-03-17: 1 via OPHTHALMIC

## 2015-03-17 MED ORDER — POTASSIUM CHLORIDE IN NACL 20-0.9 MEQ/L-% IV SOLN
INTRAVENOUS | Status: DC
  Filled 2015-03-17: qty 1000

## 2015-03-17 MED ORDER — SODIUM CHLORIDE 0.9 % IJ SOLN: 3.0000 mL | INTRAMUSCULAR | Status: DC | PRN

## 2015-03-17 MED ORDER — SENNA 8.6 MG PO TABS
1.0000 | ORAL_TABLET | Freq: Two times a day (BID) | ORAL | Status: DC
  Filled 2015-03-17 (×2): qty 1

## 2015-03-17 MED ORDER — HYDROMORPHONE HCL 1 MG/ML IJ SOLN
INTRAMUSCULAR | Status: AC
  Filled 2015-03-17: qty 1

## 2015-03-17 MED ORDER — FENTANYL CITRATE (PF) 100 MCG/2ML IJ SOLN
INTRAMUSCULAR | Status: AC
  Filled 2015-03-17: qty 2

## 2015-03-17 MED ORDER — LACTATED RINGERS IV SOLN
INTRAVENOUS | Status: DC
  Administered 2015-03-17 (×2): via INTRAVENOUS

## 2015-03-17 MED ORDER — KETOROLAC TROMETHAMINE 30 MG/ML IJ SOLN
30.0000 mg | Freq: Four times a day (QID) | INTRAMUSCULAR | Status: DC
  Administered 2015-03-17 – 2015-03-18 (×2): 30 mg via INTRAVENOUS
  Filled 2015-03-17 (×6): qty 1

## 2015-03-17 MED ORDER — CEFAZOLIN SODIUM-DEXTROSE 2-3 GM-% IV SOLR
2.0000 g | Freq: Once | INTRAVENOUS | Status: AC
  Administered 2015-03-17: 2 g via INTRAVENOUS
  Filled 2015-03-17: qty 50

## 2015-03-17 MED ORDER — FENTANYL CITRATE (PF) 250 MCG/5ML IJ SOLN
INTRAMUSCULAR | Status: AC
  Filled 2015-03-17: qty 5

## 2015-03-17 MED ORDER — PROPOFOL 10 MG/ML IV BOLUS
INTRAVENOUS | Status: AC
  Filled 2015-03-17: qty 20

## 2015-03-17 MED ORDER — ACETAMINOPHEN 325 MG PO TABS: 650.0000 mg | ORAL_TABLET | ORAL | Status: DC | PRN

## 2015-03-17 MED ORDER — LIDOCAINE HCL (CARDIAC) 20 MG/ML IV SOLN
INTRAVENOUS | Status: DC | PRN
Start: 2015-03-17 — End: 2015-03-17
  Administered 2015-03-17: 80 mg via INTRAVENOUS

## 2015-03-17 MED ORDER — KETOROLAC TROMETHAMINE 30 MG/ML IJ SOLN
30.0000 mg | Freq: Once | INTRAMUSCULAR | Status: AC | PRN
  Administered 2015-03-17: 30 mg via INTRAVENOUS

## 2015-03-17 MED ORDER — NEOSTIGMINE METHYLSULFATE 10 MG/10ML IV SOLN
INTRAVENOUS | Status: DC | PRN
  Administered 2015-03-17: 3 mg via INTRAVENOUS

## 2015-03-17 MED ORDER — NEOSTIGMINE METHYLSULFATE 10 MG/10ML IV SOLN
INTRAVENOUS | Status: AC
  Filled 2015-03-17: qty 1

## 2015-03-17 MED ORDER — MIDAZOLAM HCL 2 MG/2ML IJ SOLN
INTRAMUSCULAR | Status: AC
  Filled 2015-03-17: qty 2

## 2015-03-17 MED ORDER — HYDROMORPHONE HCL 1 MG/ML IJ SOLN
0.2500 mg | INTRAMUSCULAR | Status: DC | PRN
  Administered 2015-03-17: 0.25 mg via INTRAVENOUS
  Administered 2015-03-17: 0.5 mg via INTRAVENOUS
  Administered 2015-03-17: 0.25 mg via INTRAVENOUS

## 2015-03-17 MED ORDER — KETOROLAC TROMETHAMINE 30 MG/ML IJ SOLN
INTRAMUSCULAR | Status: AC
  Filled 2015-03-17: qty 1

## 2015-03-17 MED ORDER — HYDROCODONE-ACETAMINOPHEN 5-325 MG PO TABS
1.0000 | ORAL_TABLET | ORAL | Status: DC | PRN
  Administered 2015-03-17 – 2015-03-18 (×2): 2 via ORAL
  Filled 2015-03-17 (×2): qty 2

## 2015-03-17 SURGICAL SUPPLY — 56 items
APL SKNCLS STERI-STRIP NONHPOA (GAUZE/BANDAGES/DRESSINGS)
BAG DECANTER FOR FLEXI CONT (MISCELLANEOUS) ×1 IMPLANT
BENZOIN TINCTURE PRP APPL 2/3 (GAUZE/BANDAGES/DRESSINGS) IMPLANT
BLADE CLIPPER SURG (BLADE) IMPLANT
BUR MATCHSTICK NEURO 3.0 LAGG (BURR) ×2 IMPLANT
CANISTER SUCT 3000ML PPV (MISCELLANEOUS) ×2 IMPLANT
CONT SPEC 4OZ CLIKSEAL STRL BL (MISCELLANEOUS) ×1 IMPLANT
DECANTER SPIKE VIAL GLASS SM (MISCELLANEOUS) ×2 IMPLANT
DRAPE LAPAROTOMY 100X72X124 (DRAPES) ×2 IMPLANT
DRAPE MICROSCOPE LEICA (MISCELLANEOUS) ×2 IMPLANT
DRAPE POUCH INSTRU U-SHP 10X18 (DRAPES) ×2 IMPLANT
DRAPE SURG 17X23 STRL (DRAPES) ×2 IMPLANT
DURAPREP 26ML APPLICATOR (WOUND CARE) ×2 IMPLANT
ELECT REM PT RETURN 9FT ADLT (ELECTROSURGICAL) ×2
ELECTRODE REM PT RTRN 9FT ADLT (ELECTROSURGICAL) ×1 IMPLANT
GAUZE SPONGE 4X4 12PLY STRL (GAUZE/BANDAGES/DRESSINGS) IMPLANT
GAUZE SPONGE 4X4 16PLY XRAY LF (GAUZE/BANDAGES/DRESSINGS) IMPLANT
GLOVE BIO SURGEON STRL SZ8 (GLOVE) ×1 IMPLANT
GLOVE ECLIPSE 6.5 STRL STRAW (GLOVE) ×3 IMPLANT
GLOVE EXAM NITRILE LRG STRL (GLOVE) IMPLANT
GLOVE EXAM NITRILE MD LF STRL (GLOVE) IMPLANT
GLOVE EXAM NITRILE XL STR (GLOVE) IMPLANT
GLOVE EXAM NITRILE XS STR PU (GLOVE) IMPLANT
GLOVE INDICATOR 7.5 STRL GRN (GLOVE) ×2 IMPLANT
GLOVE INDICATOR 8.5 STRL (GLOVE) ×1 IMPLANT
GLOVE SURG SS PI 7.0 STRL IVOR (GLOVE) ×3 IMPLANT
GOWN STRL REUS W/ TWL LRG LVL3 (GOWN DISPOSABLE) ×2 IMPLANT
GOWN STRL REUS W/ TWL XL LVL3 (GOWN DISPOSABLE) IMPLANT
GOWN STRL REUS W/TWL 2XL LVL3 (GOWN DISPOSABLE) IMPLANT
GOWN STRL REUS W/TWL LRG LVL3 (GOWN DISPOSABLE) ×4
GOWN STRL REUS W/TWL XL LVL3 (GOWN DISPOSABLE) ×4
KIT BASIN OR (CUSTOM PROCEDURE TRAY) ×2 IMPLANT
KIT ROOM TURNOVER OR (KITS) ×2 IMPLANT
LIQUID BAND (GAUZE/BANDAGES/DRESSINGS) ×2 IMPLANT
NDL HYPO 18GX1.5 BLUNT FILL (NEEDLE) IMPLANT
NDL HYPO 25X1 1.5 SAFETY (NEEDLE) ×1 IMPLANT
NDL SPNL 18GX3.5 QUINCKE PK (NEEDLE) IMPLANT
NEEDLE HYPO 18GX1.5 BLUNT FILL (NEEDLE) ×2 IMPLANT
NEEDLE HYPO 25X1 1.5 SAFETY (NEEDLE) ×2 IMPLANT
NEEDLE SPNL 18GX3.5 QUINCKE PK (NEEDLE) IMPLANT
NS IRRIG 1000ML POUR BTL (IV SOLUTION) ×2 IMPLANT
PACK LAMINECTOMY NEURO (CUSTOM PROCEDURE TRAY) ×2 IMPLANT
PAD ARMBOARD 7.5X6 YLW CONV (MISCELLANEOUS) ×6 IMPLANT
RUBBERBAND STERILE (MISCELLANEOUS) ×4 IMPLANT
SPONGE LAP 4X18 X RAY DECT (DISPOSABLE) IMPLANT
SPONGE SURGIFOAM ABS GEL SZ50 (HEMOSTASIS) ×2 IMPLANT
STRIP CLOSURE SKIN 1/2X4 (GAUZE/BANDAGES/DRESSINGS) IMPLANT
SUT VIC AB 0 CT1 18XCR BRD8 (SUTURE) ×1 IMPLANT
SUT VIC AB 0 CT1 8-18 (SUTURE) ×2
SUT VIC AB 2-0 CT1 18 (SUTURE) ×2 IMPLANT
SUT VIC AB 3-0 SH 8-18 (SUTURE) ×2 IMPLANT
SYR 20ML ECCENTRIC (SYRINGE) ×2 IMPLANT
SYR 5ML LL (SYRINGE) ×1 IMPLANT
TOWEL OR 17X24 6PK STRL BLUE (TOWEL DISPOSABLE) ×2 IMPLANT
TOWEL OR 17X26 10 PK STRL BLUE (TOWEL DISPOSABLE) ×2 IMPLANT
WATER STERILE IRR 1000ML POUR (IV SOLUTION) ×2 IMPLANT

## 2015-03-17 NOTE — Discharge Instructions (Signed)
Wound Care Leave incision open to air. You may shower. Do not scrub directly on incision.  Do not put any creams, lotions, or ointments on incision. Activity Walk each and every day, increasing distance each day. No lifting greater than 5 lbs.  Avoid bending, arching, and twisting. No driving for 2 weeks; may ride as a passenger locally. If provided with back brace, wear when out of bed.  It is not necessary to wear in bed. Diet Resume your normal diet.  Return to Work Will be discussed at you follow up appointment. Call Your Doctor If Any of These Occur Redness, drainage, or swelling at the wound.  Temperature greater than 101 degrees. Severe pain not relieved by pain medication. Incision starts to come apart. Follow Up Appt Call today for appointment in 3 weeks ([phone]) or for problems.  If you have any hardware placed in your spine, you will need an x-ray before your appointment.           Lumbar Discectomy Care After A discectomy involves removal of discmaterial (the cartilage-like structures located between the bones of the back). It is done to relieve pressure on nerve roots. It can be used as a treatment for a back problem. The time in surgery depends on the findings in surgery and what is necessary to correct the problems. HOME CARE INSTRUCTIONS   Check the cut (incision) made by the surgeon twice a day for signs of infection. Some signs of infection may include:   A foul smelling, greenish or yellowish discharge from the wound.   Increased pain.   Increased redness over the incision (operative) site.   The skin edges may separate.   Flu-like symptoms (problems).   A temperature above 101.5 F (38.6 C).   Change your bandages in about 24 to 36 hours following surgery or as directed.   You may shower tomrrow.  Avoid bathtubs, swimming pools and hot tubs for three weeks or until your incision has healed completely.  Follow your doctor's instructions  as to safe activities, exercises, and physical therapy.   Weight reduction may be beneficial if you are overweight.   Daily exercise is helpful to prevent the return of problems. Walking is permitted. You may use a treadmill without an incline. Cut down on activities and exercise if you have discomfort. You may also go up and down stairs as much as you can tolerate.   DO NOT lift anything heavier than 10 to 15 lbs. Avoid bending or twisting at the waist. Always bend your knees when lifting.   Maintain strength and range of motion as instructed.   Do not drive for 10 days, or as directed by your doctors. You may be a passenger . Lying back in the passenger seat may be more comfortable for you. Always wear a seatbelt.   Limit your sitting in a regular chair to 20 to 30 minutes at a time. There are no limitations for sitting in a recliner. You should lie down or walk in between sitting periods.   Only take over-the-counter or prescription medicines for pain, discomfort, or fever as directed by your caregiver.  SEEK MEDICAL CARE IF:   There is increased bleeding (more than a small spot) from the wound.   You notice redness, swelling, or increasing pain in the wound.   Pus is coming from wound.   You develop an unexplained oral temperature above 102 F (38.9 C) develops.   You notice a foul smell coming from  the wound or dressing.   You have increasing pain in your wound.  SEEK IMMEDIATE MEDICAL CARE IF:   You develop a rash.   You have difficulty breathing.   You develop any allergic problems to medicines given.  Document Released: 07/24/2004 Document Revised: 08/08/2011 Document Reviewed: 11/12/2007 Exit[employer] Patient Information

## 2015-03-17 NOTE — Plan of Care (Signed)
Problem: Consults Goal: Diagnosis - Spinal Surgery Outcome: Completed/Met Date Met:  03/17/15 Microdiscectomy

## 2015-03-17 NOTE — Progress Notes (Signed)
Informed Dr Kalman Shan of pts EKG reading.  He will see pt in OR holding area.

## 2015-03-17 NOTE — Anesthesia Procedure Notes (Signed)
Procedure Name: Intubation Date/Time: 03/17/2015 1:36 PM Performed by: Rogers Blocker Pre-anesthesia Checklist: Patient identified, Timeout performed, Emergency Drugs available, Suction available and Patient being monitored Patient Re-evaluated:Patient Re-evaluated prior to inductionOxygen Delivery Method: Circle system utilized Preoxygenation: Pre-oxygenation with 100% oxygen Intubation Type: IV induction Ventilation: Mask ventilation without difficulty Laryngoscope Size: Miller and 2 Grade View: Grade I Tube type: Oral Tube size: 7.5 mm Number of attempts: 1 Airway Equipment and Method: Stylet Placement Confirmation: ETT inserted through vocal cords under direct vision,  breath sounds checked- equal and bilateral,  positive ETCO2 and CO2 detector Secured at: 22 cm Tube secured with: Tape Dental Injury: Teeth and Oropharynx as per pre-operative assessment

## 2015-03-17 NOTE — H&P (Signed)
BP 134/84 mmHg  Pulse 69  Temp(Src) 98.5 F (36.9 C) (Oral)  Resp 16  SpO2 100%    Steven Kirby is a 49 year old gentleman who was in his usual state of good health until approximately 1-1/2 weeks ago.  He was lifting up a large log and felt something pop in his back.  When he felt the pop he started to develop pain in the lower back and in his right lower extremity.  He also noticed that he had weakness in his leg.  He nursed it through the weekend and was doing okay until the next Wednesday of last week when he felt another pop again while not doing any kind of heavy lifting but just doing some activity around the house.  When he felt that pop he felt searing pain and went to see his physician on Thursday.  He found that his right lower extremity was very weak and his doctor told him if it did not get better by Friday morning that he should call up and that they would have him undergo an MRI.  Steven Kirby thought he had just pulled a muscle but his physician felt that this was a herniated disk.  He did have the MRI because he did not get better over Thursday and the pain only increased.  He could barely walk.  He had that MRI, which showed a fairly large disk herniation in the extraforaminal space and foraminal space at L4-5 on the right side.      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  He is right-handed.     He has weakness in his leg, buttocks, pain in his leg and back.  He says that all these symptoms started on July 3rd.  He says he has had some problems generating a urinary stream but otherwise is good.      On his pain chart he lists pain and numbness in the right buttocks and right leg anteriorly and lateral thigh posteriorly.  Pain going across the lower back.     REVIEW OF SYSTEMS:                                    Review of systems is positive for eyeglasses, back pain and leg pain.  He denies constitutional, ear, nose, throat, mouth, cardiovascular, respiratory, gastrointestinal,  genitourinary, skin, neurological, psychiatric, endocrine, hematologic and allergic problems.     PAST MEDICAL HISTORY:                                Past medical history significant for sleep apnea.               Prior Operations:  No previous operations.              Medications and Allergies:  No known drug allergies.  Medications are Naprosyn, Robaxin, oxycodone, and a prednisone taper.     FAMILY HISTORY:                                            Mother is 29.  Father is 21.  Both are in good health.     SOCIAL HISTORY:  He does not smoke, he does not use alcohol, does not use illicit drugs.     PHYSICAL EXAMINATION:                                Vital signs are as follows:  Height 5 feet 6 inches, weight 208.4 pounds, blood pressure is 134/79, pulse is 57, respiratory rate is 12, temperature is 96.5.  Pain is 8/10.     NEUROLOGICAL EXAMINATION:                       On examination, he is alert, oriented x4 and answering all questions appropriately.  He is in obvious distress.  He has weakness in his right hip flexors and quadriceps.  Far less weakness in the hip extensors or hamstrings.  He has intact proprioception in the right lower extremity.  Reflexes are intact, 2+ at the knees and ankles.  He can heel walk and toe walk, though he has difficulty toe walking on the right side.  He has normal strength and reflexes in the upper extremities.  Biceps, triceps, brachioradialis are all 2+ and he has 5/5 strength.  Pupils equal, round and reactive to light.  Full extraocular movements.  Full visual fields.  Symmetric facial sensation and movement.  Hearing intact to finger rub.  Uvula elevates in the midline.  Shoulder shrug is normal.  Tongue protrudes in the midline.      IMAGING STUDIES:                                          MRI shows a large herniated disk eccentric to the right side in the extraforaminal and foraminal space at  L4-5, compromise on the right L4 root.  He also has a disk at 1-2 where I do believe he has a small amount of soft disk present there on the right side.  He has a degenerated disk at L3-4.     IMPRESSION/PLAN:                                         I informed Steven Kirby that I thought he should undergo operative decompression of the disk herniation since he is profoundly weak already.  He says that his pain has improved since last week but is still bad enough that he would pursue surgery.  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     He is going to call the office and let me know what he would like to do and whether or not he gets this right L4-5 far lateral.  For right now, I think I will leave the L1-2 disk alone since it is fairly obvious that the big disk herniation is what is what is causing his problem.

## 2015-03-17 NOTE — Transfer of Care (Signed)
Immediate Anesthesia Transfer of Care Note  Patient: Steven Kirby  Procedure(s) Performed: Procedure(s) with comments: Right Lumbar Four- Five Far Lateral Diskectomy (Right) - Right L45 far lateral diskectomy  Patient Location: PACU  Anesthesia Type:General  Level of Consciousness: awake, alert  and oriented  Airway & Oxygen Therapy: Patient Spontanous Breathing and Patient connected to nasal cannula oxygen  Post-op Assessment: Report given to RN, Post -op Vital signs reviewed and stable and Patient moving all extremities X 4  Post vital signs: Reviewed and stable  Last Vitals:  Filed Vitals:   03/17/15 1028  BP: 134/84  Pulse: 69  Temp:   Resp:     Complications: No apparent anesthesia complications

## 2015-03-17 NOTE — Discharge Summary (Signed)
  Physician Discharge Summary  Patient ID: Steven Kirby MRN: [id number] DOB/AGE: 10-19-1965 49 y.o.  Admit date: 03/17/2015 Discharge date: 03/17/2015  Admission Diagnoses:Hnp L4/5 left  Discharge Diagnoses:  Active Problems:   HNP (herniated nucleus pulposus), lumbar   Discharged Condition: good  Hospital Course: Mr. Dacy was admitted to the hospital and taken to the operating room for an uncomplicated lumbar far lateral discetomy on the left at L4/5. Post op he is voiding, ambulating and tolerating a regular diet. His wound is clean, dry, and without signs of infection at discharge.   Treatments: surgery: as abvoe  Discharge Exam: Blood pressure 135/82, pulse 64, temperature 97.7 F (36.5 C), temperature source Oral, resp. rate 20, SpO2 95 %. General appearance: alert, cooperative, appears stated age and no distress Neurologic: Alert and oriented X 3, normal strength and tone. Normal symmetric reflexes. Normal coordination and gait  Disposition: 01-Home or Self Care lumbar herniated disc    Medication List    STOP taking these medications        oxyCODONE-acetaminophen 7.5-325 MG per tablet  Commonly known as:  PERCOCET     predniSONE 10 MG tablet  Commonly known as:  DELTASONE      TAKE these medications        HYDROcodone-acetaminophen 5-325 MG per tablet  Commonly known as:  NORCO/VICODIN  Take 1 tablet by mouth every 6 (six) hours as needed for moderate pain.     methocarbamol 500 MG tablet  Commonly known as:  ROBAXIN  Take 2 tablets (1,000 mg total) by mouth 2 (two) times daily.     naproxen 500 MG tablet  Commonly known as:  NAPROSYN  Take 1 tablet (500 mg total) by mouth 2 (two) times daily with a meal.           Follow-up Information    Follow up with Cassadi Purdie L, MD In 3 weeks.   Specialty:  Neurosurgery   Why:  call office to make an appointment   Contact information:   [address] [place]  [address] [phone]       Signed: Winfield Cunas 03/17/2015, 5:37 PM

## 2015-03-17 NOTE — Anesthesia Postprocedure Evaluation (Signed)
  Anesthesia Post-op Note  Patient: Steven Kirby  Procedure(s) Performed: Procedure(s) (LRB): Right Lumbar Four- Five Far Lateral Diskectomy (Right)  Patient Location: PACU  Anesthesia Type: General  Level of Consciousness: awake and alert   Airway and Oxygen Therapy: Patient Spontanous Breathing  Post-op Pain: mild  Post-op Assessment: Post-op Vital signs reviewed, Patient's Cardiovascular Status Stable, Respiratory Function Stable, Patent Airway and No signs of Nausea or vomiting  Last Vitals:  Filed Vitals:   03/17/15 1515  BP: 121/75  Pulse: 61  Temp:   Resp: 19    Post-op Vital Signs: stable   Complications: No apparent anesthesia complications

## 2015-03-17 NOTE — Anesthesia Preprocedure Evaluation (Signed)
Anesthesia Evaluation  Patient identified by MRN, date of birth, ID band Patient awake    Reviewed: Allergy & Precautions, NPO status , Patient's Chart, lab work & pertinent test results  Airway Mallampati: II  TM Distance: <3 FB Neck ROM: Full    Dental no notable dental hx.    Pulmonary sleep apnea and Continuous Positive Airway Pressure Ventilation ,  breath sounds clear to auscultation  Pulmonary exam normal       Cardiovascular negative cardio ROS Normal cardiovascular examRhythm:Regular Rate:Normal     Neuro/Psych negative neurological ROS  negative psych ROS   GI/Hepatic negative GI ROS, Neg liver ROS,   Endo/Other  obesity  Renal/GU negative Renal ROS  negative genitourinary   Musculoskeletal negative musculoskeletal ROS (+)   Abdominal   Peds negative pediatric ROS (+)  Hematology negative hematology ROS (+)   Anesthesia Other Findings   Reproductive/Obstetrics negative OB ROS                             Anesthesia Physical Anesthesia Plan  ASA: III  Anesthesia Plan: General   Post-op Pain Management:    Induction: Intravenous  Airway Management Planned: Oral ETT  Additional Equipment:   Intra-op Plan:   Post-operative Plan: Extubation in OR  Informed Consent: I have reviewed the patients History and Physical, chart, labs and discussed the procedure including the risks, benefits and alternatives for the proposed anesthesia with the patient or authorized representative who has indicated his/her understanding and acceptance.   Dental advisory given  Plan Discussed with: CRNA and Surgeon  Anesthesia Plan Comments:         Anesthesia Quick Evaluation

## 2015-03-17 NOTE — Op Note (Signed)
03/17/2015  3:12 PM  PATIENT:  Steven Kirby  49 y.o. male with severe pain in the left lower extremity  PRE-OPERATIVE DIAGNOSIS:  lumbar herniated disc far lateral L4/5 left  POST-OPERATIVE DIAGNOSIS:  lumbar herniated disc far Lateral L4/5 left  PROCEDURE:  Procedure(s): Right Lumbar Four- Five Far Lateral Diskectomy  SURGEON:   Surgeon(s): Ashok Pall, MD Kary Kos, MD  ASSISTANTS:Cram, Dominica Severin  ANESTHESIA:   general  EBL:  Total I/O In: 1000 [I.V.:1000] Out: 100 [Blood:100]  BLOOD ADMINISTERED:none  CELL SAVER GIVEN:none  COUNT:per nursing  DRAINS: none   SPECIMEN:  No Specimen  DICTATION: Steven Kirby was taken to the operating room, intubated and placed under a general anesthetic without difficulty. He was positioned prone on a Wilson frame with all pressure points padded. His back was prepped and draped in a sterile manner. I opened the skin with a 10 blade and carried the dissection down to the thoracolumbar fascia. I used both sharp dissection and the monopolar cautery to expose the lamina of L4, and the l4 pars on the left. I confirmed my location with an intraoperative xray.  I used the drill, Kerrison punches, and curettes to perform a semihemilaminectomy of L4, on the pars. I used the punches to remove the ligamentum flavum to expose the L4 nerve root. I brought the microscope into the operative field and with Dr.Cram's assistance we started our decompression of the  L4 root(s).the nerve root was tense, and tented over the disc rupture.I retracted the nerve root rostrally and disc expressed itself. Dr. Saintclair Halsted removed the disc with rongeurs, and eventually removed a very large free fragment of disc. We controlled the bleeding achieving hemostasis. I then soaked the resection site with depomedrol and fentanyl. I explored rostrally, laterally, medially, and caudally and was satisfied with the decompression. I irrigated the wound, then closed in layers. I approximated the  thoracolumbar fascia, subcutaneous, and subcuticular planes with vicryl sutures. I used dermabond for a sterile dressing.   PLAN OF CARE: Admit for overnight observation  PATIENT DISPOSITION:  PACU - hemodynamically stable.   Delay start of Pharmacological VTE agent (>24hrs) due to surgical blood loss or risk of bleeding:  yes

## 2015-03-18 DIAGNOSIS — M5126 Other intervertebral disc displacement, lumbar region: Secondary | ICD-10-CM | POA: Diagnosis not present

## 2015-03-18 NOTE — Progress Notes (Signed)
Patient alert and oriented, mae's well, voiding adequate amount of urine, swallowing without difficulty, c/o pain and medication given prior to discharged. Patient discharged home with family. Script and discharged instructions given to patient. Patient and family stated understanding of d/c instructions given and has an appointment with MD.  

## 2015-03-20 ENCOUNTER — Encounter (HOSPITAL_COMMUNITY): Payer: Self-pay | Admitting: Neurosurgery

## 2015-12-13 DIAGNOSIS — H9041 Sensorineural hearing loss, unilateral, right ear, with unrestricted hearing on the contralateral side: Secondary | ICD-10-CM | POA: Diagnosis not present

## 2015-12-13 DIAGNOSIS — H9311 Tinnitus, right ear: Secondary | ICD-10-CM | POA: Diagnosis not present

## 2016-01-09 DIAGNOSIS — L821 Other seborrheic keratosis: Secondary | ICD-10-CM | POA: Diagnosis not present

## 2016-01-09 DIAGNOSIS — D239 Other benign neoplasm of skin, unspecified: Secondary | ICD-10-CM | POA: Diagnosis not present

## 2016-01-09 DIAGNOSIS — L570 Actinic keratosis: Secondary | ICD-10-CM | POA: Diagnosis not present

## 2016-03-19 IMAGING — MR MR THORACIC SPINE W/O CM
5 of 13 series · 18 of 48 positions shown · non-contrast
Comparison: None.

:
CLINICAL DATA: Right leg weakness. Low back pain

EXAM:
MRI THORACIC AND LUMBAR SPINE WITHOUT CONTRAST
TECHNIQUE: Multiplanar and multiecho pulse sequences of the thoracic and lumbar
spine were obtained without intravenous contrast.

[Series 400: T2 · sagittal · 3.0mm · 0.66mm/px · 2 of 13 slices shown (1 of 5)]
[im 1/13]
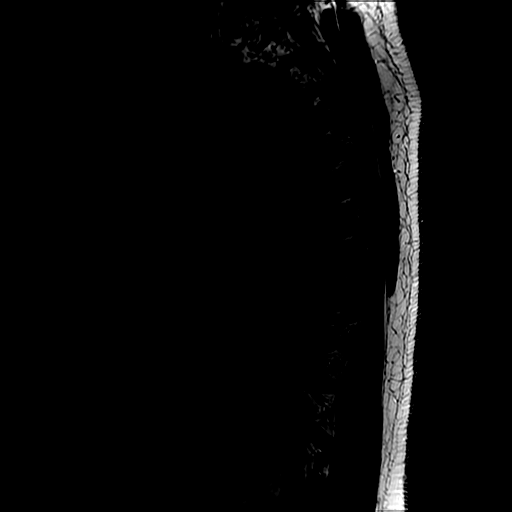
[im 13/13]
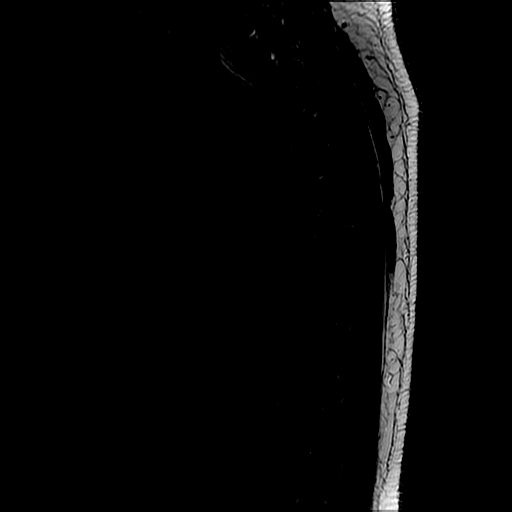

[Series 700: T2 · axial · 4.0mm · 0.39mm/px · z∈[-131,-3]mm · 5 of 26 slices shown (2 of 5)]
[im 1/26]
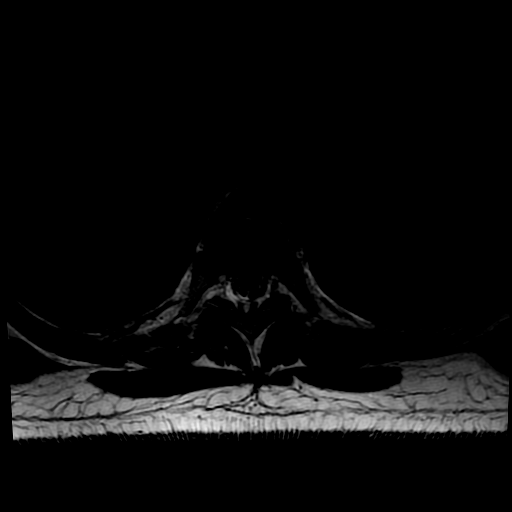
[im 7/26]
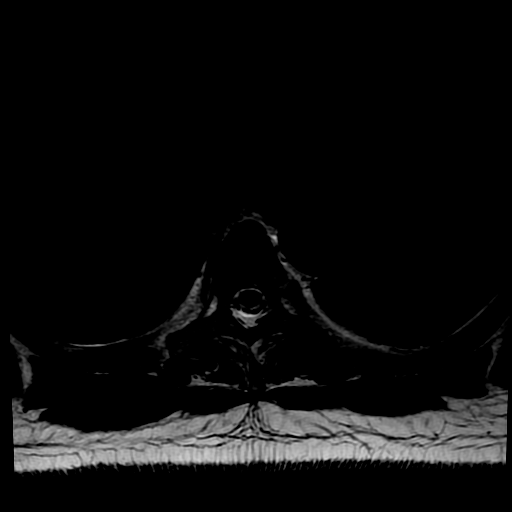
[im 13/26]
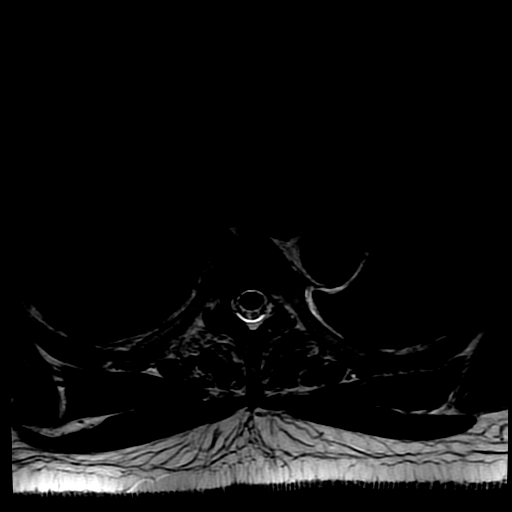
[im 19/26]
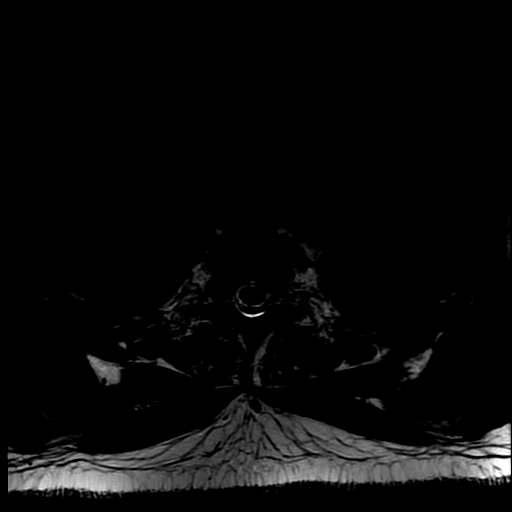
[im 26/26]
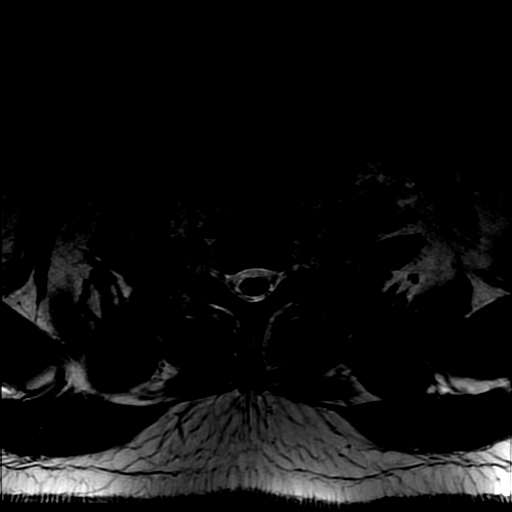

[Series 800: T2 · axial · 4.0mm · 0.39mm/px · z∈[-250,-97]mm · 5 of 29 slices shown (3 of 5)]
[im 1/29]
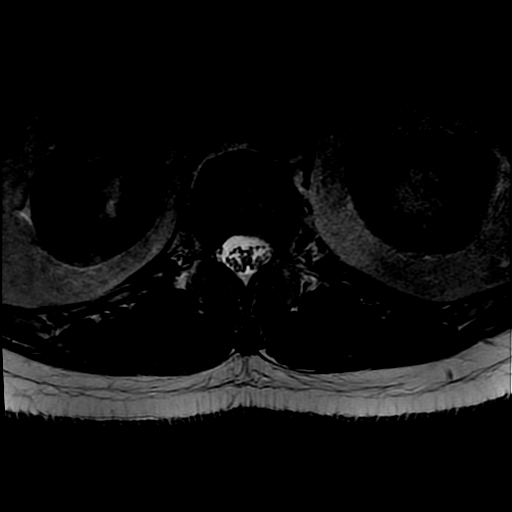
[im 8/29]
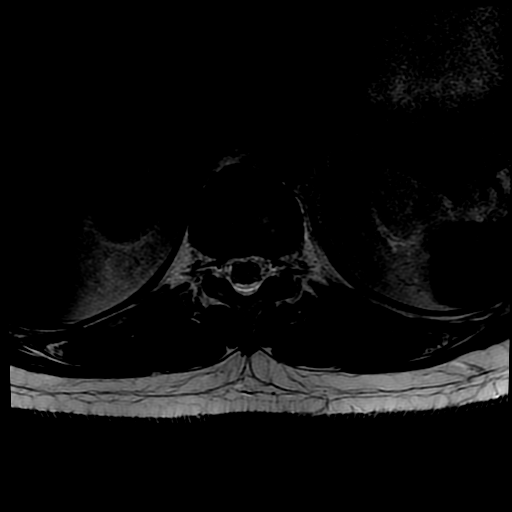
[im 15/29]
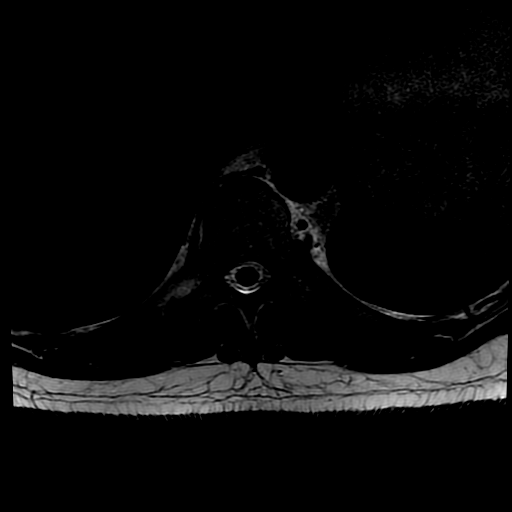
[im 22/29]
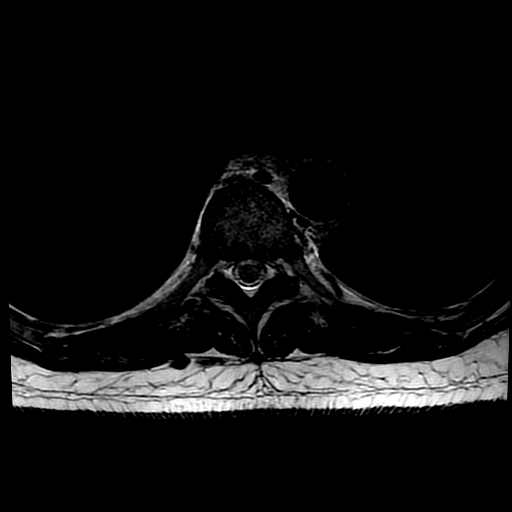
[im 29/29]
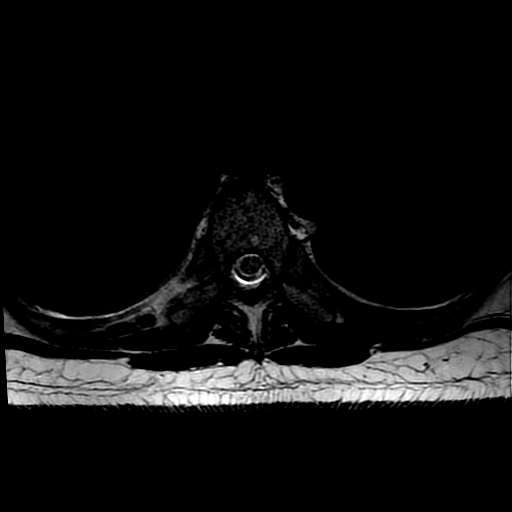

[Series 1200: T2 · sagittal · 4.0mm · 0.55mm/px · 2 of 13 slices shown (4 of 5)]
[im 1/13]
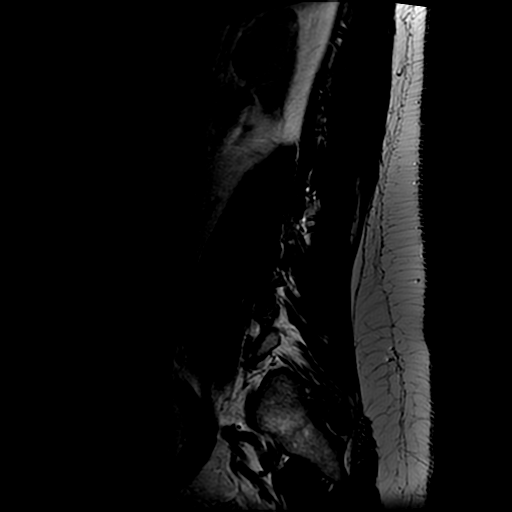
[im 13/13]
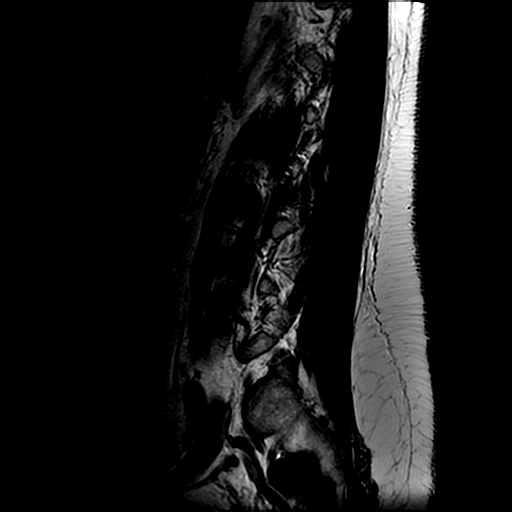

[Series 1500: T2 · axial · 4.0mm · 0.39mm/px · z∈[-439,-324]mm · 4 of 34 slices shown (5 of 5)]
[im 1/34]
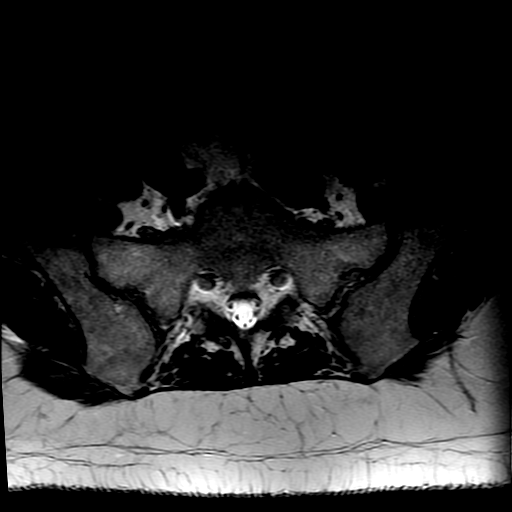
[im 7/34]
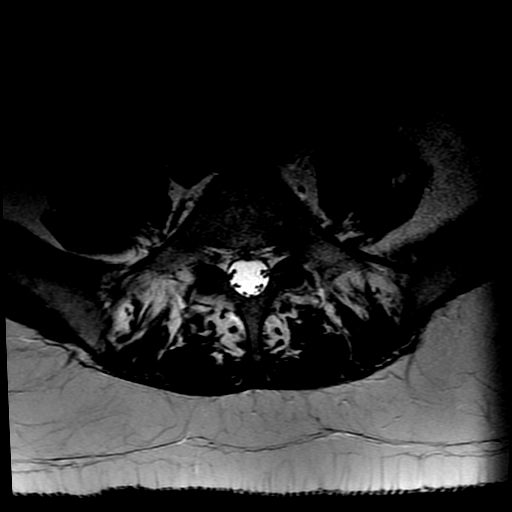
[im 14/34]
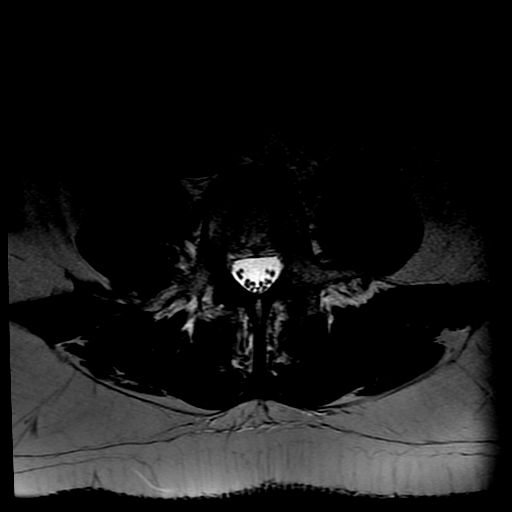
[im 20/34]
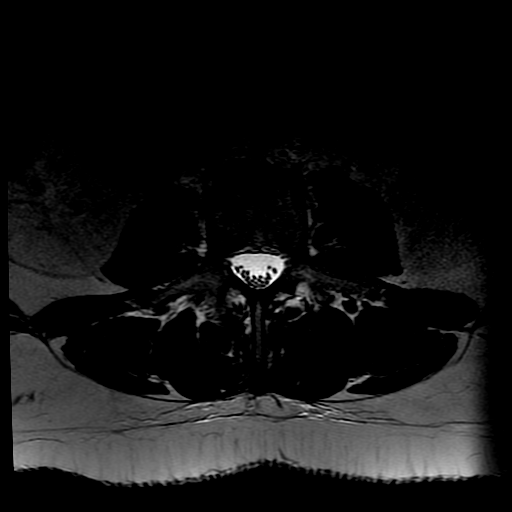

[18 of 48 positions shown; findings below may reference images not displayed]

FINDINGS: MR THORACIC SPINE FINDINGS

Negative for thoracic spine fracture or mass. No bone marrow edema.
Spinal cord signal normal. No cord compression or cord mass.

Thoracic disc degeneration. Abnormal disc spaces are described below

T5-6: Small right paracentral disc protrusion without stenosis

T6-7: Mild disc degeneration without stenosis. Small right-sided
disc protrusion.

T10-11: Small to moderate right-sided disc protrusion with mild
flattening of the cord. No cord compression

T11-12: Small central disc protrusion without stenosis.

MR LUMBAR SPINE FINDINGS

Normal lumbar alignment. Negative for fracture or mass lesion. Conus
medullaris normal and terminates at mid L1

L1-2: Small right foraminal disc protrusion. No compression of the
right L1 nerve root. No significant spinal stenosis

L2-3: Mild disc degeneration and diffuse bulging of the disc without
significant spinal stenosis.

L3-4: Moderate disc degeneration. Fatty changes in the endplates
right-greater-than-left with associated disc space narrowing. Small
right lateral disc protrusion with displacement of the right L3
nerve root. Mild facet degeneration. Mild narrowing of the canal.

L4-5: Moderately large right lateral disc protrusion with potential
for compression of the right L4 nerve root in the foramen and
lateral to the foramen. Mild facet degeneration without spinal
stenosis

L5-S1: Small central disc protrusion without neural impingement
IMPRESSION: MR THORACIC SPINE IMPRESSION

Small central disc protrusions T5-6 and T6-7.

Small moderate right-sided disc protrusion T10-11

Small central disc protrusion T11-12

MR LUMBAR SPINE IMPRESSION

Small right foraminal disc protrusion L1-2

Small right lateral disc protrusion L3-4

Moderately large right lateral disc protrusion L4-5 with likely
compression of the right L4 nerve root in the foramen. This may be a
cause of the patient's right leg weakness.

## 2016-05-14 DIAGNOSIS — R140 Abdominal distension (gaseous): Secondary | ICD-10-CM | POA: Diagnosis not present

## 2016-05-14 DIAGNOSIS — R194 Change in bowel habit: Secondary | ICD-10-CM | POA: Diagnosis not present

## 2016-07-16 DIAGNOSIS — H9311 Tinnitus, right ear: Secondary | ICD-10-CM | POA: Diagnosis not present

## 2016-07-16 DIAGNOSIS — H9042 Sensorineural hearing loss, unilateral, left ear, with unrestricted hearing on the contralateral side: Secondary | ICD-10-CM | POA: Diagnosis not present

## 2016-07-16 DIAGNOSIS — H903 Sensorineural hearing loss, bilateral: Secondary | ICD-10-CM | POA: Diagnosis not present

## 2016-07-30 DIAGNOSIS — H8142 Vertigo of central origin, left ear: Secondary | ICD-10-CM | POA: Diagnosis not present

## 2016-07-30 DIAGNOSIS — H8102 Meniere's disease, left ear: Secondary | ICD-10-CM | POA: Diagnosis not present

## 2016-07-30 DIAGNOSIS — H9312 Tinnitus, left ear: Secondary | ICD-10-CM | POA: Diagnosis not present

## 2016-07-30 DIAGNOSIS — H903 Sensorineural hearing loss, bilateral: Secondary | ICD-10-CM | POA: Diagnosis not present

## 2016-08-08 DIAGNOSIS — H8102 Meniere's disease, left ear: Secondary | ICD-10-CM | POA: Diagnosis not present

## 2016-08-08 DIAGNOSIS — H9311 Tinnitus, right ear: Secondary | ICD-10-CM | POA: Diagnosis not present

## 2016-08-08 DIAGNOSIS — J322 Chronic ethmoidal sinusitis: Secondary | ICD-10-CM | POA: Diagnosis not present

## 2016-08-08 DIAGNOSIS — J320 Chronic maxillary sinusitis: Secondary | ICD-10-CM | POA: Diagnosis not present

## 2016-09-15 DIAGNOSIS — H6690 Otitis media, unspecified, unspecified ear: Secondary | ICD-10-CM | POA: Diagnosis not present

## 2016-09-24 DIAGNOSIS — J329 Chronic sinusitis, unspecified: Secondary | ICD-10-CM | POA: Diagnosis not present

## 2017-04-14 DIAGNOSIS — Z13220 Encounter for screening for lipoid disorders: Secondary | ICD-10-CM | POA: Diagnosis not present

## 2017-04-14 DIAGNOSIS — K219 Gastro-esophageal reflux disease without esophagitis: Secondary | ICD-10-CM | POA: Diagnosis not present

## 2017-04-14 DIAGNOSIS — J309 Allergic rhinitis, unspecified: Secondary | ICD-10-CM | POA: Diagnosis not present

## 2017-04-14 DIAGNOSIS — Z0000 Encounter for general adult medical examination without abnormal findings: Secondary | ICD-10-CM | POA: Diagnosis not present

## 2017-04-14 DIAGNOSIS — Z125 Encounter for screening for malignant neoplasm of prostate: Secondary | ICD-10-CM | POA: Diagnosis not present

## 2017-04-14 DIAGNOSIS — K625 Hemorrhage of anus and rectum: Secondary | ICD-10-CM | POA: Diagnosis not present

## 2017-04-14 DIAGNOSIS — R5383 Other fatigue: Secondary | ICD-10-CM | POA: Diagnosis not present

## 2017-05-21 DIAGNOSIS — G4733 Obstructive sleep apnea (adult) (pediatric): Secondary | ICD-10-CM | POA: Diagnosis not present

## 2017-06-17 DIAGNOSIS — G4733 Obstructive sleep apnea (adult) (pediatric): Secondary | ICD-10-CM | POA: Diagnosis not present

## 2017-06-19 DIAGNOSIS — G4733 Obstructive sleep apnea (adult) (pediatric): Secondary | ICD-10-CM | POA: Diagnosis not present

## 2018-01-16 LAB — GLUCOSE, POCT (MANUAL RESULT ENTRY): POC Glucose: 132 mg/dl — AB (ref 70–99)

## 2018-02-13 DIAGNOSIS — H40013 Open angle with borderline findings, low risk, bilateral: Secondary | ICD-10-CM | POA: Diagnosis not present

## 2018-02-13 DIAGNOSIS — H11153 Pinguecula, bilateral: Secondary | ICD-10-CM | POA: Diagnosis not present

## 2018-02-13 DIAGNOSIS — H2513 Age-related nuclear cataract, bilateral: Secondary | ICD-10-CM | POA: Diagnosis not present

## 2018-02-13 DIAGNOSIS — H04123 Dry eye syndrome of bilateral lacrimal glands: Secondary | ICD-10-CM | POA: Diagnosis not present

## 2018-03-01 DIAGNOSIS — K529 Noninfective gastroenteritis and colitis, unspecified: Secondary | ICD-10-CM | POA: Diagnosis not present

## 2018-05-14 DIAGNOSIS — J069 Acute upper respiratory infection, unspecified: Secondary | ICD-10-CM | POA: Diagnosis not present

## 2018-07-08 DIAGNOSIS — H11153 Pinguecula, bilateral: Secondary | ICD-10-CM | POA: Diagnosis not present

## 2018-07-08 DIAGNOSIS — T1592XA Foreign body on external eye, part unspecified, left eye, initial encounter: Secondary | ICD-10-CM | POA: Diagnosis not present

## 2018-07-08 DIAGNOSIS — H40013 Open angle with borderline findings, low risk, bilateral: Secondary | ICD-10-CM | POA: Diagnosis not present

## 2018-07-08 DIAGNOSIS — H04123 Dry eye syndrome of bilateral lacrimal glands: Secondary | ICD-10-CM | POA: Diagnosis not present

## 2018-10-01 DIAGNOSIS — R42 Dizziness and giddiness: Secondary | ICD-10-CM | POA: Diagnosis not present

## 2018-10-01 DIAGNOSIS — Z23 Encounter for immunization: Secondary | ICD-10-CM | POA: Diagnosis not present

## 2018-10-01 DIAGNOSIS — Z125 Encounter for screening for malignant neoplasm of prostate: Secondary | ICD-10-CM | POA: Diagnosis not present

## 2018-10-01 DIAGNOSIS — H539 Unspecified visual disturbance: Secondary | ICD-10-CM | POA: Diagnosis not present

## 2018-10-06 DIAGNOSIS — H04123 Dry eye syndrome of bilateral lacrimal glands: Secondary | ICD-10-CM | POA: Diagnosis not present

## 2018-10-06 DIAGNOSIS — H40013 Open angle with borderline findings, low risk, bilateral: Secondary | ICD-10-CM | POA: Diagnosis not present

## 2018-10-06 DIAGNOSIS — T1592XA Foreign body on external eye, part unspecified, left eye, initial encounter: Secondary | ICD-10-CM | POA: Diagnosis not present

## 2018-10-06 DIAGNOSIS — H11153 Pinguecula, bilateral: Secondary | ICD-10-CM | POA: Diagnosis not present

## 2018-11-03 NOTE — Progress Notes (Signed)
[facility]    Provider:  Dr Jaynee Eagles Referring Provider: Maury Dus, MD Primary Care Provider:  Maury Dus, MD  CC:  Near-syncope  HPI:  Steven Kirby is a 53 y.o. male here as requested by provider Maury Dus, MD for dizzy spells and asked to rule out seizures or migraines.  Past medical history includes obesity, GERD, obstructive sleep apnea, tinnitus, and situational stress. In November he started having light-headedness, very dizzy, he may have to steady himself to keep from falling down, has happened while he was driving, he can remember the events and feels he cannot control his movements. He thought it was stress but has had it without stress. He has been seeing "stars"  And a single floater that is always there. Her saw his eye doctor and possibly occular migraines. He also has a lot of problems with sinuses. No hx of seizures. He cannot remember the last time he had a migraine and he never this symptom with the migraine. Hearing changes in the right ear and hearing loss. No loss of consciousness but confusion and not being able to control movements. Very brief, no nausea, no "black outs" and he has lost vision. He feels he is moving but he is not.  No SOB, no CP, not pale, no clammy sweat.  One time he had stars he sat down after exercising. 7 near black outs in Jan-Feb and 9 episodes of vision changes/stars of vision loss in Jan - Feb. His sleep apnea is mild. No other focal neurologic deficits, associated symptoms, inciting events or modifiable factors.  Reviewed notes, labs and imaging from outside physicians, which showed:  Reviewed labs which showed TSH 0.78, CMP normal with BUN 16 and creatinine 1.01, CBC with differential normal, labs were taken October 01, 2018.  Patient presented last to Dr. Orest Dikes office with concerns with blackouts.  He describes them as "almost" blackouts which started 2 months prior.  Last appointment October 01, 2018.  No  consistency with activity or body position.,  Feels like it numbs him where he sees stars".  Gets dizzy and lightheaded.  He has not actually lost consciousness.  Patient reports this ongoing over the past 3 months at random times.  They have happened during stressful events.  Sometimes he is was just he is standing at a farm store.  Happened another time and then he saw black spots for a minute or so.  When the spell happens, he feels first pressure behind his eyes, then he feels like he is out of body and numb and he is not in control of what he is doing but he is able to continue to drive if he is driving.  Most recently when he had a spell when driving he felt like he had to pull over and was able to do so.  No one has witnessed these.  The episodes last 5 to 30 seconds.  He feels fine afterwards.  Can be sitting down when it happens.  No chest pain shortness of breath or nausea.  Head pressure last 30 seconds or so.  No family history of migraines.  He has a history of "stress headache" years ago.  Stress has been high recently with work they are extremely busy.  Also son who had substance abuse has been clean for 9 months.  Apparently an MRI was ordered but I do not see those results with the paperwork.  Review of Systems: Patient complains of symptoms per HPI as well as  the following symptoms: dizzy. Pertinent negatives and positives per HPI. All others negative.   Social History   Socioeconomic History  . Marital status: Married    Spouse name: Not on file  . Number of children: Not on file  . Years of education: Not on file  . Highest education level: Not on file  Occupational History  . Not on file  Social Needs  . Financial resource strain: Not on file  . Food insecurity:    Worry: Not on file    Inability: Not on file  . Transportation needs:    Medical: Not on file    Non-medical: Not on file  Tobacco Use  . Smoking status: Never Smoker  . Smokeless tobacco: Never Used    Substance and Sexual Activity  . Alcohol use: No  . Drug use: No  . Sexual activity: Not on file  Lifestyle  . Physical activity:    Days per week: Not on file    Minutes per session: Not on file  . Stress: Not on file  Relationships  . Social connections:    Talks on phone: Not on file    Gets together: Not on file    Attends religious service: Not on file    Active member of club or organization: Not on file    Attends meetings of clubs or organizations: Not on file    Relationship status: Not on file  . Intimate partner violence:    Fear of current or ex partner: Not on file    Emotionally abused: Not on file    Physically abused: Not on file    Forced sexual activity: Not on file  Other Topics Concern  . Not on file  Social History Narrative  . Not on file    Family History  Problem Relation Age of Onset  . Hypertension Father   . Diverticulitis Other        without polys  . Hypertension Other   . Hyperlipidemia Other   . Colon cancer Other     Past Medical History:  Diagnosis Date  . Allergic rhinitis   . Anxiety   . Anxiety   . Chronic low back pain   . Chronic sinusitis   . Hemorrhage of rectum and anus   . Hemorrhoids   . History of MRSA infection   . OSA (obstructive sleep apnea)     Patient Active Problem List   Diagnosis Date Noted  . HNP (herniated nucleus pulposus), lumbar 03/17/2015  . Obstructive sleep apnea on CPAP 06/07/2013  . Obesity (BMI 30-39.9) 06/07/2013    Past Surgical History:  Procedure Laterality Date  . COLONOSCOPY    . LUMBAR LAMINECTOMY/DECOMPRESSION MICRODISCECTOMY Right 03/17/2015   Procedure: Right Lumbar Four- Five Far Lateral Diskectomy;  Surgeon: Ashok Pall, MD;  Location: [facility];  Service: Neurosurgery;  Laterality: Right;  Right L45 far lateral diskectomy  . MOLE REMOVAL    . VASECTOMY      Current Outpatient Medications  Medication Sig Dispense Refill  . Acetaminophen (TYLENOL 8 HOUR PO) Take by  mouth as needed.    . cetirizine (ZYRTEC) 10 MG tablet Take by mouth.     No current facility-administered medications for this visit.     Allergies as of 11/04/2018  . (No Known Allergies)    Vitals: BP 120/83 (BP Location: Right Arm, Patient Position: Sitting, Cuff Size: Normal)   Pulse 67   Ht 5\' 5"  (1.651 m)   Wt  216 lb (98 kg)   BMI 35.94 kg/m  Last Weight:  Wt Readings from Last 1 Encounters:  11/04/18 216 lb (98 kg)   Last Height:   Ht Readings from Last 1 Encounters:  11/04/18 5\' 5"  (1.651 m)     Physical exam: Exam: Gen: NAD, conversant, well nourised, obese, well groomed                     CV: RRR, no MRG. No Carotid Bruits. No peripheral edema, warm, nontender Eyes: Conjunctivae clear without exudates or hemorrhage  Neuro: Detailed Neurologic Exam  Speech:    Speech is normal; fluent and spontaneous with normal comprehension.  Cognition:    The patient is oriented to person, place, and time;     recent and remote memory intact;     language fluent;     normal attention, concentration,     fund of knowledge Cranial Nerves:    The pupils are equal, round, and reactive to light. The fundi are flat. Visual fields are full to finger confrontation. Extraocular movements are intact. Trigeminal sensation is intact and the muscles of mastication are normal. The face is symmetric. The palate elevates in the midline. Hearing intact. Voice is normal. Shoulder shrug is normal. The tongue has normal motion without fasciculations.   Coordination:    No dysmetria  Gait:    Normal native gait  Motor Observation:    No asymmetry, no atrophy, and no involuntary movements noted. Tone:    Normal muscle tone.    Posture:    Posture is normal. normal erect    Strength:    Strength is V/V in the upper and lower limbs.      Sensation: intact to LT     Reflex Exam:  DTR's:    Deep tendon reflexes in the upper and lower extremities are normal bilaterally.    Toes:    The toes are downgoing bilaterally.   Clonus:    Clonus is absent.    Assessment/Plan:  53 y.o. male here as requested by provider Maury Dus, MD for dizzy spells and asked to rule out seizures or migraines.  Past medical history includes obesity, GERD, obstructive sleep apnea, tinnitus, and situational stress.  Since last November, patient has started having multiple episodes a month of lightheadedness, dizziness, presyncope which are brief.  He is also having episodic vision changes and vision loss as well as hearing loss of the right ear.  Some episodes he feels confused and not able to control movements.  There is a very wide differential including labyrinthitis, benign positional vertigo, stroke, intracranial lesion such as schwannoma, seizures, vestibular migraines, cardiac arrhythmias or other etiologies.  - MRI of the brain w/wo contrast with seizure protocol. - May consider CT Angio of the head and neck after MRI -EEG to evaluate for epileptiform activity of the brain - If routine EEG is negative consider a 48-hour EEG. - Vestibular therapy: If work-up is negative, we will consider this - Recommend cardiac holter monitor for 2 weeks to evaluate for any cardiac arrhythmias. - Patient does have a history of remote migraines and has some visual changes that may be consistent with migraine aura however he has never had these symptoms in the setting of migraines.  Vestibular migraines are a diagnosis of exclusion. - Will follow up in 3 months or sooner, in the meantime the above testing will occur   Orders Placed This Encounter  Procedures  . MR BRAIN W  WO CONTRAST  . CARDIAC EVENT MONITOR  . EEG     Cc: Maury Dus, MD,    Sarina Ill, MD  [facility] [address] [place], [place] [address]  Phone [phone] Fax [phone]

## 2018-11-04 ENCOUNTER — Encounter: Payer: Self-pay | Admitting: Neurology

## 2018-11-04 ENCOUNTER — Encounter

## 2018-11-04 ENCOUNTER — Ambulatory Visit (INDEPENDENT_AMBULATORY_CARE_PROVIDER_SITE_OTHER): Payer: BLUE CROSS/BLUE SHIELD | Admitting: Neurology

## 2018-11-04 VITALS — BP 120/83 | HR 67 | Ht 65.0 in | Wt 216.0 lb

## 2018-11-04 DIAGNOSIS — H53139 Sudden visual loss, unspecified eye: Secondary | ICD-10-CM

## 2018-11-04 DIAGNOSIS — R55 Syncope and collapse: Secondary | ICD-10-CM

## 2018-11-04 DIAGNOSIS — R42 Dizziness and giddiness: Secondary | ICD-10-CM

## 2018-11-04 DIAGNOSIS — G9340 Encephalopathy, unspecified: Secondary | ICD-10-CM | POA: Diagnosis not present

## 2018-11-04 DIAGNOSIS — H9191 Unspecified hearing loss, right ear: Secondary | ICD-10-CM

## 2018-11-04 DIAGNOSIS — R569 Unspecified convulsions: Secondary | ICD-10-CM

## 2018-11-04 DIAGNOSIS — I499 Cardiac arrhythmia, unspecified: Secondary | ICD-10-CM

## 2018-11-04 NOTE — Patient Instructions (Addendum)
EEG MRI of the brain with and without contrast May consider imaging of the blood vessels of the head (CT) Recommend cardiac holter monitor Then consider a 48-hour eeg Also consider Vestibular Therapy  Dizziness Dizziness is a common problem. It is a feeling of unsteadiness or light-headedness. You may feel like you are about to faint. Dizziness can lead to injury if you stumble or fall. Anyone can become dizzy, but dizziness is more common in older adults. This condition can be caused by a number of things, including medicines, dehydration, or illness. Follow these instructions at home: Eating and drinking  Drink enough fluid to keep your urine clear or pale yellow. This helps to keep you from becoming dehydrated. Try to drink more clear fluids, such as water.  Do not drink alcohol.  Limit your caffeine intake if told to do so by your health care provider. Check ingredients and nutrition facts to see if a food or beverage contains caffeine.  Limit your salt (sodium) intake if told to do so by your health care provider. Check ingredients and nutrition facts to see if a food or beverage contains sodium. Activity  Avoid making quick movements. ? Rise slowly from chairs and steady yourself until you feel okay. ? In the morning, first sit up on the side of the bed. When you feel okay, stand slowly while you hold onto something until you know that your balance is fine.  If you need to stand in one place for a long time, move your legs often. Tighten and relax the muscles in your legs while you are standing.  Do not drive or use heavy machinery if you feel dizzy.  Avoid bending down if you feel dizzy. Place items in your home so that they are easy for you to reach without leaning over. Lifestyle  Do not use any products that contain nicotine or tobacco, such as cigarettes and e-cigarettes. If you need help quitting, ask your health care provider.  Try to reduce your stress level by using  methods such as yoga or meditation. Talk with your health care provider if you need help to manage your stress. General instructions  Watch your dizziness for any changes.  Take over-the-counter and prescription medicines only as told by your health care provider. Talk with your health care provider if you think that your dizziness is caused by a medicine that you are taking.  Tell a friend or a family member that you are feeling dizzy. If he or she notices any changes in your behavior, have this person call your health care provider.  Keep all follow-up visits as told by your health care provider. This is important. Contact a health care provider if:  Your dizziness does not go away.  Your dizziness or light-headedness gets worse.  You feel nauseous.  You have reduced hearing.  You have new symptoms.  You are unsteady on your feet or you feel like the room is spinning. Get help right away if:  You vomit or have diarrhea and are unable to eat or drink anything.  You have problems talking, walking, swallowing, or using your arms, hands, or legs.  You feel generally weak.  You are not thinking clearly or you have trouble forming sentences. It may take a friend or family member to notice this.  You have chest pain, abdominal pain, shortness of breath, or sweating.  Your vision changes.  You have any bleeding.  You have a severe headache.  You have neck pain  or a stiff neck.  You have a fever. These symptoms may represent a serious problem that is an emergency. Do not wait to see if the symptoms will go away. Get medical help right away. Call your local emergency services (911 in the [place] Do not drive yourself to the hospital. Summary  Dizziness is a feeling of unsteadiness or light-headedness. This condition can be caused by a number of things, including medicines, dehydration, or illness.  Anyone can become dizzy, but dizziness is more common in older adults.  Drink  enough fluid to keep your urine clear or pale yellow. Do not drink alcohol.  Avoid making quick movements if you feel dizzy. Monitor your dizziness for any changes. This information is not intended to replace advice given to you by your health care provider. Make sure you discuss any questions you have with your health care provider. Document Released: 02/12/2001 Document Revised: 09/21/2016 Document Reviewed: 09/21/2016 Elsevier Interactive Patient Education  2019 [employer].

## 2018-11-09 ENCOUNTER — Telehealth: Payer: Self-pay | Admitting: Neurology

## 2018-11-09 ENCOUNTER — Other Ambulatory Visit: Payer: Self-pay | Admitting: Neurology

## 2018-11-09 MED ORDER — ALPRAZOLAM 0.25 MG PO TABS: ORAL_TABLET | ORAL | 0 refills | Status: DC

## 2018-11-09 NOTE — Telephone Encounter (Signed)
MR BRain w/wo contrast Dr. Ihor Dow Auth: [id number] (exp. 11/06/18 to 12/05/18). Patient is scheduled at [facility] for 11/14/18. Patient also inform me he is claustrophobic and would like something to help him.

## 2018-11-09 NOTE — Telephone Encounter (Signed)
Xanax sent in thanks!

## 2018-11-12 ENCOUNTER — Other Ambulatory Visit: Payer: BLUE CROSS/BLUE SHIELD

## 2018-11-12 ENCOUNTER — Telehealth: Payer: Self-pay | Admitting: Neurology

## 2018-11-12 NOTE — Telephone Encounter (Signed)
I called [facility] in [place] @ [phone] and spoke with Mordecai Rasmussen. She stated the Xanax is ready for pickup.

## 2018-11-12 NOTE — Telephone Encounter (Signed)
I called pt at [phone] and LVM (ok per DPR) advising pt that Dr. Jaynee Eagles called in Xanax 0.25 mg tablet to [facility] in [place] on 3/9. Advised pt of instructions, take 1-2 tablets by mouth 30-60 minutes prior to his MRI. May repeat if needed. I advised pt not be driving while on this medication. Have someone take him to and from the MRI. I asked for a call back with any questions and left office number in message.

## 2018-11-12 NOTE — Telephone Encounter (Signed)
Pt is claustrophobic and needs something for MRI test. Pharmacy: [facility]/[name]. RN please call to advise

## 2018-11-17 ENCOUNTER — Ambulatory Visit (INDEPENDENT_AMBULATORY_CARE_PROVIDER_SITE_OTHER): Payer: BLUE CROSS/BLUE SHIELD

## 2018-11-17 ENCOUNTER — Other Ambulatory Visit: Payer: Self-pay

## 2018-11-17 DIAGNOSIS — R42 Dizziness and giddiness: Secondary | ICD-10-CM

## 2018-11-17 DIAGNOSIS — R55 Syncope and collapse: Secondary | ICD-10-CM | POA: Diagnosis not present

## 2018-11-17 DIAGNOSIS — H9191 Unspecified hearing loss, right ear: Secondary | ICD-10-CM

## 2018-11-17 DIAGNOSIS — Z136 Encounter for screening for cardiovascular disorders: Secondary | ICD-10-CM | POA: Diagnosis not present

## 2018-11-17 DIAGNOSIS — Z0000 Encounter for general adult medical examination without abnormal findings: Secondary | ICD-10-CM | POA: Diagnosis not present

## 2018-11-17 DIAGNOSIS — Z23 Encounter for immunization: Secondary | ICD-10-CM | POA: Diagnosis not present

## 2018-11-17 DIAGNOSIS — R569 Unspecified convulsions: Secondary | ICD-10-CM

## 2018-11-17 DIAGNOSIS — G9340 Encephalopathy, unspecified: Secondary | ICD-10-CM | POA: Diagnosis not present

## 2018-11-17 DIAGNOSIS — H53139 Sudden visual loss, unspecified eye: Secondary | ICD-10-CM

## 2018-11-17 MED ORDER — GADOBENATE DIMEGLUMINE 529 MG/ML IV SOLN
20.0000 mL | Freq: Once | INTRAVENOUS | Status: AC | PRN
  Administered 2018-11-17: 20 mL via INTRAVENOUS

## 2018-11-19 ENCOUNTER — Other Ambulatory Visit: Payer: BLUE CROSS/BLUE SHIELD

## 2018-11-25 ENCOUNTER — Telehealth: Payer: Self-pay | Admitting: *Deleted

## 2018-11-25 NOTE — Telephone Encounter (Signed)
-----   Message from Melvenia Beam, MD sent at 11/23/2018  2:08 PM EDT ----- MRI of the brain was normal. At appointment we discussed getting CTA of the head and neck to look at the blood vessels if the brain was normal. Let me know if he is willing thanks

## 2018-11-25 NOTE — Telephone Encounter (Signed)
Spoke with patient and discussed MRI results and Dr. Cathren Laine recommendations. Patient's questions were answered. He is willing to have the CT-A head & neck ordered to see if insurance will pay however he said he wants to do a little research and think about it. He says he has improved. Also discussed that d/t current pandemic it may be awhile until this would happen. He was fine with that. He verbalized appreciation for the call.

## 2018-11-30 NOTE — Addendum Note (Signed)
Addended by: Sarina Ill B on: 11/30/2018 11:51 AM   Modules accepted: Orders

## 2018-11-30 NOTE — Telephone Encounter (Signed)
Syncopal episodes looking for carotid stenosis

## 2018-11-30 NOTE — Telephone Encounter (Signed)
Noted, thank you

## 2018-12-01 ENCOUNTER — Telehealth: Payer: Self-pay | Admitting: Neurology

## 2018-12-01 NOTE — Telephone Encounter (Signed)
[id number] (exp. 12/01/18 to 05/29/19) order sent to [facility]. They will reach out to the pt to schedule.

## 2018-12-16 ENCOUNTER — Other Ambulatory Visit: Payer: BLUE CROSS/BLUE SHIELD

## 2019-01-27 DIAGNOSIS — M7711 Lateral epicondylitis, right elbow: Secondary | ICD-10-CM | POA: Diagnosis not present

## 2019-02-01 ENCOUNTER — Telehealth: Payer: Self-pay

## 2019-02-01 NOTE — Telephone Encounter (Signed)
Spoke with the patient and he wanted to push his appt out a couple months. Patient has been r/s for 04/05/2019 at 3:15 pm. Patient is aware of appt and time.

## 2019-02-04 ENCOUNTER — Ambulatory Visit: Payer: BLUE CROSS/BLUE SHIELD | Admitting: Neurology

## 2019-04-05 ENCOUNTER — Ambulatory Visit: Payer: Self-pay | Admitting: Neurology

## 2019-06-15 DIAGNOSIS — B349 Viral infection, unspecified: Secondary | ICD-10-CM | POA: Diagnosis not present

## 2019-09-01 DIAGNOSIS — M25521 Pain in right elbow: Secondary | ICD-10-CM | POA: Diagnosis not present

## 2019-09-01 DIAGNOSIS — Z23 Encounter for immunization: Secondary | ICD-10-CM | POA: Diagnosis not present

## 2019-09-01 DIAGNOSIS — R140 Abdominal distension (gaseous): Secondary | ICD-10-CM | POA: Diagnosis not present

## 2019-09-01 DIAGNOSIS — E6609 Other obesity due to excess calories: Secondary | ICD-10-CM | POA: Diagnosis not present

## 2019-09-01 DIAGNOSIS — M6281 Muscle weakness (generalized): Secondary | ICD-10-CM | POA: Diagnosis not present

## 2019-09-15 DIAGNOSIS — M25521 Pain in right elbow: Secondary | ICD-10-CM | POA: Diagnosis not present

## 2019-09-17 ENCOUNTER — Other Ambulatory Visit: Payer: Self-pay | Admitting: Orthopedic Surgery

## 2019-09-17 DIAGNOSIS — M6788 Other specified disorders of synovium and tendon, other site: Secondary | ICD-10-CM

## 2019-09-17 DIAGNOSIS — M25521 Pain in right elbow: Secondary | ICD-10-CM

## 2019-09-24 DIAGNOSIS — M67921 Unspecified disorder of synovium and tendon, right upper arm: Secondary | ICD-10-CM | POA: Diagnosis not present

## 2019-09-29 DIAGNOSIS — M25521 Pain in right elbow: Secondary | ICD-10-CM | POA: Diagnosis not present

## 2019-10-08 DIAGNOSIS — S56911D Strain of unspecified muscles, fascia and tendons at forearm level, right arm, subsequent encounter: Secondary | ICD-10-CM | POA: Diagnosis not present

## 2019-10-08 DIAGNOSIS — S46211D Strain of muscle, fascia and tendon of other parts of biceps, right arm, subsequent encounter: Secondary | ICD-10-CM | POA: Diagnosis not present

## 2019-10-13 DIAGNOSIS — S56911D Strain of unspecified muscles, fascia and tendons at forearm level, right arm, subsequent encounter: Secondary | ICD-10-CM | POA: Diagnosis not present

## 2019-10-13 DIAGNOSIS — S46211D Strain of muscle, fascia and tendon of other parts of biceps, right arm, subsequent encounter: Secondary | ICD-10-CM | POA: Diagnosis not present

## 2019-10-15 DIAGNOSIS — S56911D Strain of unspecified muscles, fascia and tendons at forearm level, right arm, subsequent encounter: Secondary | ICD-10-CM | POA: Diagnosis not present

## 2019-10-15 DIAGNOSIS — S46211D Strain of muscle, fascia and tendon of other parts of biceps, right arm, subsequent encounter: Secondary | ICD-10-CM | POA: Diagnosis not present

## 2019-10-18 DIAGNOSIS — S46211D Strain of muscle, fascia and tendon of other parts of biceps, right arm, subsequent encounter: Secondary | ICD-10-CM | POA: Diagnosis not present

## 2019-10-18 DIAGNOSIS — S56911D Strain of unspecified muscles, fascia and tendons at forearm level, right arm, subsequent encounter: Secondary | ICD-10-CM | POA: Diagnosis not present

## 2019-11-03 DIAGNOSIS — S56911D Strain of unspecified muscles, fascia and tendons at forearm level, right arm, subsequent encounter: Secondary | ICD-10-CM | POA: Diagnosis not present

## 2019-11-03 DIAGNOSIS — S46211D Strain of muscle, fascia and tendon of other parts of biceps, right arm, subsequent encounter: Secondary | ICD-10-CM | POA: Diagnosis not present

## 2019-12-01 ENCOUNTER — Ambulatory Visit (INDEPENDENT_AMBULATORY_CARE_PROVIDER_SITE_OTHER): Payer: BC Managed Care – PPO | Admitting: Dermatology

## 2019-12-01 ENCOUNTER — Encounter: Payer: Self-pay | Admitting: Dermatology

## 2019-12-01 ENCOUNTER — Other Ambulatory Visit: Payer: Self-pay

## 2019-12-01 DIAGNOSIS — D225 Melanocytic nevi of trunk: Secondary | ICD-10-CM | POA: Diagnosis not present

## 2019-12-01 DIAGNOSIS — D229 Melanocytic nevi, unspecified: Secondary | ICD-10-CM

## 2019-12-01 DIAGNOSIS — L570 Actinic keratosis: Secondary | ICD-10-CM | POA: Diagnosis not present

## 2019-12-01 NOTE — Patient Instructions (Signed)
First visit and a half dozen years for a fair skinned young man who historically spent time outdoors.  A waist up examination showed no atypical moles or melanoma.  On his face and left ear there are multiple subtle pink and tan crusts which represent solar keratoses.  3 thicker lesions on the temples and left ear were treated with liquid nitrogen freeze; he understands that he will be able to judge results for at least a month.  He will continue to do sun protection.  Next November we will consider a course of topical fluorouracil or photodynamic light therapy.

## 2019-12-01 NOTE — Progress Notes (Addendum)
   Follow-Up Visit   Subjective  Steven Kirby is a 54 y.o. male who presents for the following: Skin Problem (Here for a couple of places on his face. States nothing has any itching, bleeding or flaking.).  Crusts + check moles Location: face, ear Duration: months Quality: increased number Associated Signs/Symptoms:sore Modifying Factors: past freezing helped Severity:  Timing: Context:   The following portions of the chart were reviewed this encounter and updated as appropriate:     Objective  Well appearing patient in no apparent distress; mood and affect are within normal limits.  All skin waist up examined.   Assessment & Plan  AK (actinic keratosis) (3) Left Mid Helix; Left Zygomatic Area; Right Zygomatic Area  Destruction of lesion - Left Mid Helix, Left Zygomatic Area, Right Zygomatic Area  Destruction method: cryotherapy   Informed consent: discussed and consent obtained   Timeout:  patient name, date of birth, surgical site, and procedure verified Lesion destroyed using liquid nitrogen: Yes   Region frozen until ice ball extended beyond lesion: Yes   Outcome: patient tolerated procedure well with no complications    Nevus (2) Right Upper Back; Mid Back  observe

## 2019-12-27 ENCOUNTER — Encounter: Payer: Self-pay | Admitting: Cardiology

## 2019-12-27 DIAGNOSIS — Z125 Encounter for screening for malignant neoplasm of prostate: Secondary | ICD-10-CM | POA: Diagnosis not present

## 2019-12-27 DIAGNOSIS — Z833 Family history of diabetes mellitus: Secondary | ICD-10-CM | POA: Diagnosis not present

## 2019-12-27 DIAGNOSIS — Z0000 Encounter for general adult medical examination without abnormal findings: Secondary | ICD-10-CM | POA: Diagnosis not present

## 2019-12-27 DIAGNOSIS — Z13220 Encounter for screening for lipoid disorders: Secondary | ICD-10-CM | POA: Diagnosis not present

## 2019-12-27 DIAGNOSIS — R0600 Dyspnea, unspecified: Secondary | ICD-10-CM | POA: Diagnosis not present

## 2019-12-27 DIAGNOSIS — R140 Abdominal distension (gaseous): Secondary | ICD-10-CM | POA: Diagnosis not present

## 2020-01-03 NOTE — Progress Notes (Signed)
Cardiology Office Note   Date:  01/05/2020   ID:  Steven Kirby, DOB Jun 10, 1966, MRN [id number]  PCP:  Maury Dus, MD  Cardiologist:   Connor Meacham Martinique, MD   Chief Complaint  Patient presents with  . Shortness of Breath      History of Present Illness: Steven Kirby is a 54 y.o. male who is seen at the request of Dr Moreen Fowler for evaluation of DOE. He has a history of OSA. He reports that over the past 8-12 months he notes he gets winded easily with exertion. Denies any chest pain or pressure. Does not have SOB at rest. No cough, edema, palpitations. 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 Does note some life stressors. Reports his sleep apnea is mild. Does not use CPAP since he states he would subconsciously remove it at night anyway.    Past Medical History:  Diagnosis Date  . Allergic rhinitis   . Anxiety   . Anxiety   . Chronic low back pain   . Chronic sinusitis   . Hemorrhage of rectum and anus   . Hemorrhoids   . History of MRSA infection   . OSA (obstructive sleep apnea)     Past Surgical History:  Procedure Laterality Date  . COLONOSCOPY    . LUMBAR LAMINECTOMY/DECOMPRESSION MICRODISCECTOMY Right 03/17/2015   Procedure: Right Lumbar Four- Five Far Lateral Diskectomy;  Surgeon: Ashok Pall, MD;  Location: [facility];  Service: Neurosurgery;  Laterality: Right;  Right L45 far lateral diskectomy  . MOLE REMOVAL    . VASECTOMY       Current Outpatient Medications  Medication Sig Dispense Refill  . cetirizine (ZYRTEC) 10 MG tablet Take by mouth.     No current facility-administered medications for this visit.    Allergies:   Patient has no known allergies.    Social History:  The patient  reports that he has never smoked. He has never used smokeless tobacco. He reports that he does not drink alcohol or use drugs. He works as a Civil engineer, contracting. Reports son is an addict  Family History:  The patient's family history includes  Colon cancer in an other family member; Diverticulitis in an other family member; Hyperlipidemia in an other family member; Hypertension in his father and another family member.    ROS:  Please see the history of present illness.   Otherwise, review of systems are positive for none.   All other systems are reviewed and negative.    PHYSICAL EXAM: VS:  BP 132/82   Pulse (!) 56   Temp (!) 97.2 F (36.2 C)   Ht 5\' 6"  (1.676 m)   Wt 232 lb (105.2 kg)   SpO2 98%   BMI 37.45 kg/m  , BMI Body mass index is 37.45 kg/m. GEN: Well nourished, obese, in no acute distress  HEENT: normal  Neck: no JVD, carotid bruits, or masses Cardiac: RRR; no murmurs, rubs, or gallops,no edema  Respiratory:  clear to auscultation bilaterally, normal work of breathing GI: soft, nontender, nondistended, + BS MS: no deformity or atrophy  Skin: warm and dry, no rash Neuro:  Strength and sensation are intact Psych: euthymic mood, full affect   EKG:  EKG is ordered today. The ekg ordered today demonstrates NSR rate 56. RBBB. T wave inversion inferiorly. No change from 2016. I have personally reviewed and interpreted this study.    Recent Labs: No results found for requested labs within  last 8760 hours.    Lipid Panel No results found for: CHOL, TRIG, HDL, CHOLHDL, VLDL, LDLCALC, LDLDIRECT   Labs dated 12/27/19: cholesterol 149, triglycerides 75, HDL 54, LDL 80. A1c 5.6%. creatinine 1.01. other Chemistries, CBC, and TSH normal.  Wt Readings from Last 3 Encounters:  01/05/20 232 lb (105.2 kg)  11/04/18 216 lb (98 kg)  03/16/15 207 lb 8 oz (94.1 kg)      Other studies Reviewed: Additional studies/ records that were reviewed today include: none   ASSESSMENT AND PLAN:  1.  Dyspnea on exertion. This may be related to his obesity and OSA. Also need to consider possible anginal equivalent symptoms and assess LV function. Recommend stress Myoview to assess.  2. RBBB. Noted in 2016 but not 2007. Explained  that this is felt to be a benign finding. 3. Obesity with OSA.   Current medicines are reviewed at length with the patient today.  The patient does not have concerns regarding medicines.  The following changes have been made:  no change  Labs/ tests ordered today include:   Orders Placed This Encounter  Procedures  . Myocardial Perfusion Imaging  . EKG 12-Lead     Disposition:   FU TBD   Signed, Kensington Duerst Martinique, MD  01/05/2020 10:49 AM    [facility] [address], [place], [place], [address] Phone [phone], Fax [phone]

## 2020-01-05 ENCOUNTER — Other Ambulatory Visit: Payer: Self-pay

## 2020-01-05 ENCOUNTER — Encounter: Payer: Self-pay | Admitting: Cardiology

## 2020-01-05 ENCOUNTER — Ambulatory Visit (INDEPENDENT_AMBULATORY_CARE_PROVIDER_SITE_OTHER): Payer: BC Managed Care – PPO | Admitting: Cardiology

## 2020-01-05 VITALS — BP 132/82 | HR 56 | Temp 97.2°F | Ht 66.0 in | Wt 232.0 lb

## 2020-01-05 DIAGNOSIS — R0609 Other forms of dyspnea: Secondary | ICD-10-CM

## 2020-01-05 DIAGNOSIS — R0600 Dyspnea, unspecified: Secondary | ICD-10-CM

## 2020-01-05 DIAGNOSIS — I4510 Unspecified right bundle-branch block: Secondary | ICD-10-CM | POA: Diagnosis not present

## 2020-01-05 NOTE — Patient Instructions (Signed)
Medication Instructions:  Continue same medications    Lab Work: None ordered    Testing/Procedures: Schedule Stress Myoview   Follow-Up: At [facility], you and your health needs are our priority.  As part of our continuing mission to provide you with exceptional heart care, we have created designated Provider Care Teams.  These Care Teams include your primary Cardiologist (physician) and Advanced Practice Providers (APPs -  Physician Assistants and Nurse Practitioners) who all work together to provide you with the care you need, when you need it.  We recommend signing up for the patient portal called "MyChart".  Sign up information is provided on this After Visit Summary.  MyChart is used to connect with patients for Virtual Visits (Telemedicine).  Patients are able to view lab/test results, encounter notes, upcoming appointments, etc.  Non-urgent messages can be sent to your provider as well.   To learn more about what you can do with MyChart, go to [email or web address].    Your next appointment: To be determined after test    The format for your next appointment: Office     Provider:  Dr.Jordan

## 2020-01-11 DIAGNOSIS — R197 Diarrhea, unspecified: Secondary | ICD-10-CM | POA: Diagnosis not present

## 2020-01-18 ENCOUNTER — Encounter (HOSPITAL_COMMUNITY): Payer: BC Managed Care – PPO

## 2020-01-20 ENCOUNTER — Telehealth (HOSPITAL_COMMUNITY): Payer: Self-pay

## 2020-01-20 NOTE — Telephone Encounter (Signed)
Encounter complete. 

## 2020-01-21 ENCOUNTER — Other Ambulatory Visit (HOSPITAL_COMMUNITY)
Admission: RE | Admit: 2020-01-21 | Discharge: 2020-01-21 | Disposition: A | Discharge status: Home / Self Care | Payer: BC Managed Care – PPO | Source: Ambulatory Visit | Attending: Cardiology | Admitting: Cardiology

## 2020-01-21 DIAGNOSIS — Z20822 Contact with and (suspected) exposure to covid-19: Secondary | ICD-10-CM | POA: Insufficient documentation

## 2020-01-21 DIAGNOSIS — Z01812 Encounter for preprocedural laboratory examination: Secondary | ICD-10-CM | POA: Insufficient documentation

## 2020-01-21 LAB — SARS CORONAVIRUS 2 (TAT 6-24 HRS): SARS Coronavirus 2: NEGATIVE

## 2020-01-25 ENCOUNTER — Other Ambulatory Visit: Payer: Self-pay

## 2020-01-25 ENCOUNTER — Ambulatory Visit (HOSPITAL_COMMUNITY)
Admission: RE | Admit: 2020-01-25 | Discharge: 2020-01-25 | Disposition: A | Discharge status: Home / Self Care | Payer: BC Managed Care – PPO | Source: Ambulatory Visit | Attending: Cardiology | Admitting: Cardiology

## 2020-01-25 DIAGNOSIS — R0609 Other forms of dyspnea: Secondary | ICD-10-CM

## 2020-01-25 DIAGNOSIS — R0600 Dyspnea, unspecified: Secondary | ICD-10-CM | POA: Insufficient documentation

## 2020-01-25 DIAGNOSIS — I4510 Unspecified right bundle-branch block: Secondary | ICD-10-CM

## 2020-01-25 LAB — MYOCARDIAL PERFUSION IMAGING
Estimated workload: 11.3 METS
Exercise duration (min): 10 min
Exercise duration (sec): 55 s
LV dias vol: 87 mL (ref 62–150)
LV sys vol: 40 mL
MPHR: 166 {beats}/min
Peak HR: 155 {beats}/min
Percent HR: 93 %
Rest HR: 68 {beats}/min
SDS: 0
SRS: 0
SSS: 0
TID: 0.99

## 2020-01-25 MED ORDER — TECHNETIUM TC 99M TETROFOSMIN IV KIT
32.1000 | PACK | Freq: Once | INTRAVENOUS | Status: AC | PRN
  Administered 2020-01-25: 32.1 via INTRAVENOUS
  Filled 2020-01-25: qty 33

## 2020-01-25 MED ORDER — TECHNETIUM TC 99M TETROFOSMIN IV KIT
10.9000 | PACK | Freq: Once | INTRAVENOUS | Status: AC | PRN
Start: 2020-01-25 — End: 2020-01-25
  Administered 2020-01-25: 10.9 via INTRAVENOUS
  Filled 2020-01-25: qty 11

## 2020-01-25 MED ORDER — REGADENOSON 0.4 MG/5ML IV SOLN
0.4000 mg | Freq: Once | INTRAVENOUS | Status: AC
  Administered 2020-01-25: 0.4 mg via INTRAVENOUS

## 2020-01-26 ENCOUNTER — Telehealth: Payer: Self-pay | Admitting: Cardiology

## 2020-01-26 NOTE — Telephone Encounter (Signed)
-----   Message from Luanna Salk, LPN sent at [id number]  5:35 PM EDT ----- Patient needs echo scheduled    Thanks

## 2020-01-26 NOTE — Telephone Encounter (Signed)
Left message for patient to call the office to schedule Echo

## 2020-02-14 ENCOUNTER — Other Ambulatory Visit: Payer: Self-pay

## 2020-02-14 ENCOUNTER — Ambulatory Visit (HOSPITAL_COMMUNITY): Payer: BC Managed Care – PPO | Attending: Cardiovascular Disease

## 2020-02-14 DIAGNOSIS — R0600 Dyspnea, unspecified: Secondary | ICD-10-CM | POA: Insufficient documentation

## 2020-02-14 DIAGNOSIS — R0609 Other forms of dyspnea: Secondary | ICD-10-CM

## 2020-02-14 DIAGNOSIS — I4510 Unspecified right bundle-branch block: Secondary | ICD-10-CM | POA: Diagnosis not present

## 2020-02-14 MED ORDER — PERFLUTREN LIPID MICROSPHERE
1.0000 mL | INTRAVENOUS | Status: AC | PRN
  Administered 2020-02-14: 2 mL via INTRAVENOUS

## 2020-02-16 ENCOUNTER — Telehealth: Payer: Self-pay

## 2020-02-16 DIAGNOSIS — R0609 Other forms of dyspnea: Secondary | ICD-10-CM

## 2020-02-16 DIAGNOSIS — R0600 Dyspnea, unspecified: Secondary | ICD-10-CM

## 2020-02-16 NOTE — Telephone Encounter (Signed)
Spoke to patient echo results given.Scheduler will call back with Pulmonary appointment.Stated he would like appointment before his deductible starts over 7/1.Stated he has a high deductible.Advised I will make scheduler aware.Order placed.

## 2020-04-25 ENCOUNTER — Other Ambulatory Visit: Payer: Self-pay

## 2020-04-25 ENCOUNTER — Encounter: Payer: Self-pay | Admitting: Pulmonary Disease

## 2020-04-25 ENCOUNTER — Ambulatory Visit (INDEPENDENT_AMBULATORY_CARE_PROVIDER_SITE_OTHER): Payer: BC Managed Care – PPO | Admitting: Pulmonary Disease

## 2020-04-25 VITALS — BP 128/84 | HR 67 | Temp 97.7°F | Ht 66.0 in | Wt 212.8 lb

## 2020-04-25 DIAGNOSIS — R0600 Dyspnea, unspecified: Secondary | ICD-10-CM

## 2020-04-25 NOTE — Progress Notes (Signed)
Steven Kirby    [id number]    31-Jul-1966  Primary Care Physician:Reade, Herbie [place], MD  Referring Physician: Martinique, Peter M, MD [address] [place],  [place] [address]  Chief complaint:   Patient being seen with shortness of breath on exertion  HPI:  Shortness of breath with exertion for about a year Symptoms does not appear to be progressive  He was walking on level ground without hurry, he is not limited Able to walk on a treadmill for about 30 minutes with about 4 mph without feeling limited Denies any other pain or discomfort  Has had episodes of lightheadedness, feeling of about being about to pass out Was evaluated by cardiology Echocardiogram did reveal mild pulmonary hypertension  He has a history of obstructive sleep apnea diagnosed about 3 to 4 years ago Was able to tolerate CPAP for about a month and stopped using it He was always taking the mask off during the night and just did not have any restorative sleep from attempting to use CPAP  His weight currently is about 2025 pounds higher than then He was diagnosed with mild obstructive sleep apnea at the time Usually goes to bed between 10 and 1030 Final wake up time between 5 and 5:45 AM May wake up about once a night  Dad has obstructive sleep apnea  Denies any other diagnosis that may be related or predisposed to pulmonary hypertension  Never smoker   Outpatient Encounter Medications as of 04/25/2020  Medication Sig  . cetirizine (ZYRTEC) 10 MG tablet Take by mouth.   No facility-administered encounter medications on file as of 04/25/2020.    Allergies as of 04/25/2020  . (No Known Allergies)    Past Medical History:  Diagnosis Date  . Allergic rhinitis   . Anxiety   . Anxiety   . Chronic low back pain   . Chronic sinusitis   . Hemorrhage of rectum and anus   . Hemorrhoids   . History of MRSA infection   . OSA (obstructive sleep apnea)     Past Surgical History:   Procedure Laterality Date  . COLONOSCOPY    . LUMBAR LAMINECTOMY/DECOMPRESSION MICRODISCECTOMY Right 03/17/2015   Procedure: Right Lumbar Four- Five Far Lateral Diskectomy;  Surgeon: Ashok Pall, MD;  Location: [facility];  Service: Neurosurgery;  Laterality: Right;  Right L45 far lateral diskectomy  . MOLE REMOVAL    . VASECTOMY      Family History  Problem Relation Age of Onset  . Hypertension Father   . Diverticulitis Other        without polys  . Hypertension Other   . Hyperlipidemia Other   . Colon cancer Other     Social History   Socioeconomic History  . Marital status: Married    Spouse name: Not on file  . Number of children: Not on file  . Years of education: Not on file  . Highest education level: Not on file  Occupational History  . Not on file  Tobacco Use  . Smoking status: Never Smoker  . Smokeless tobacco: Never Used  Vaping Use  . Vaping Use: Never used  Substance and Sexual Activity  . Alcohol use: No  . Drug use: No  . Sexual activity: Not on file  Other Topics Concern  . Not on file  Social History Narrative  . Not on file   Social Determinants of Health   Financial Resource Strain:   .  Difficulty of Paying Living Expenses: Not on file  Food Insecurity:   . Worried About Charity fundraiser in the Last Year: Not on file  . Ran Out of Food in the Last Year: Not on file  Transportation Needs:   . Lack of Transportation (Medical): Not on file  . Lack of Transportation (Non-Medical): Not on file  Physical Activity:   . Days of Exercise per Week: Not on file  . Minutes of Exercise per Session: Not on file  Stress:   . Feeling of Stress : Not on file  Social Connections:   . Frequency of Communication with Friends and Family: Not on file  . Frequency of Social Gatherings with Friends and Family: Not on file  . Attends Religious Services: Not on file  . Active Member of Clubs or Organizations: Not on file  . Attends Archivist  Meetings: Not on file  . Marital Status: Not on file  Intimate Partner Violence:   . Fear of Current or Ex-Partner: Not on file  . Emotionally Abused: Not on file  . Physically Abused: Not on file  . Sexually Abused: Not on file    Review of Systems  Respiratory: Positive for apnea and shortness of breath.   Psychiatric/Behavioral: Positive for sleep disturbance.    Vitals:   04/25/20 1037  BP: 128/84  Pulse: 67  Temp: 97.7 F (36.5 C)  SpO2: 97%     Physical Exam Constitutional:      Appearance: He is obese.  HENT:     Head: Normocephalic.     Nose: No congestion.     Mouth/Throat:     Mouth: Mucous membranes are moist.     Comments: Mallampati 3, crowded oropharynx Cardiovascular:     Rate and Rhythm: Normal rate and regular rhythm.     Pulses: Normal pulses.     Heart sounds: Normal heart sounds. No murmur heard.  No friction rub.  Pulmonary:     Effort: Pulmonary effort is normal. No respiratory distress.     Breath sounds: Normal breath sounds. No stridor. No wheezing or rhonchi.  Musculoskeletal:     Cervical back: No rigidity or tenderness.  Skin:    General: Skin is warm.  Neurological:     General: No focal deficit present.     Mental Status: He is alert.    Data Reviewed: Echocardiogram with mild pulmonary hypertension 02/14/2020  Assessment:  Mild pulmonary hypertension  Past history of obstructive sleep apnea -Attempted to get used to using CPAP on a regular basis-could not, was taking mask off during the night -Weight is at least 20 pounds higher than when the study was done  Does not appear to have any other predisposition to pulmonary hypertension    Plan/Recommendations: Will benefit from evaluation for obstructive sleep apnea  Will order a home sleep study -Treatment options discussed with the patient -Importance of weight loss discussed with the patient  I will recommend right heart catheterization to adequately assess his  right-sided pressures Relationship between pulmonary hypertension and his obstructive sleep apnea was discussed with the patient May have nocturnal desaturations as a contributor  I will see him in about 6 weeks  Encouraged to call with any significant concerns Sherrilyn Rist MD [facility] 04/25/2020, 11:11 AM  CC: Martinique, Peter M, MD

## 2020-04-25 NOTE — Patient Instructions (Signed)
Shortness of breath Pulmonary hypertension on recent echocardiogram  Will recommend right heart catheterization to accurately measure pulmonary pressures  We will obtain a home sleep study Treatment options as we discussed Initial treatment will be CPAP therapy  I will see you back in about 6 weeks  Graded exercise as tolerated Aggressive weight loss measures  Call with significant concerns

## 2020-04-26 ENCOUNTER — Telehealth: Payer: Self-pay

## 2020-04-26 NOTE — Telephone Encounter (Signed)
Spoke with the patient who confirmed that he can attend the appointment with Dr. Martinique tomorrow.

## 2020-04-26 NOTE — Telephone Encounter (Signed)
Called patient left message on personal voice mail appointment scheduled with Dr.Jordan 8/26 at 11:20 am to arrange a right cardiac cath.

## 2020-04-26 NOTE — Telephone Encounter (Signed)
    Pt returning call from [place], she said he can come in to the appt tomorrow but he has coupler of question and ask to call him back.

## 2020-04-27 ENCOUNTER — Other Ambulatory Visit: Payer: Self-pay

## 2020-04-27 ENCOUNTER — Other Ambulatory Visit: Payer: Self-pay | Admitting: Cardiology

## 2020-04-27 ENCOUNTER — Ambulatory Visit (INDEPENDENT_AMBULATORY_CARE_PROVIDER_SITE_OTHER): Payer: BC Managed Care – PPO | Admitting: Cardiology

## 2020-04-27 ENCOUNTER — Encounter: Payer: Self-pay | Admitting: Cardiology

## 2020-04-27 VITALS — BP 120/70 | HR 58 | Ht 66.0 in | Wt 211.4 lb

## 2020-04-27 DIAGNOSIS — R0600 Dyspnea, unspecified: Secondary | ICD-10-CM | POA: Diagnosis not present

## 2020-04-27 DIAGNOSIS — I272 Pulmonary hypertension, unspecified: Secondary | ICD-10-CM

## 2020-04-27 DIAGNOSIS — I4510 Unspecified right bundle-branch block: Secondary | ICD-10-CM | POA: Diagnosis not present

## 2020-04-27 DIAGNOSIS — R0609 Other forms of dyspnea: Secondary | ICD-10-CM

## 2020-04-27 MED ORDER — SODIUM CHLORIDE 0.9% FLUSH: 3.0000 mL | Freq: Two times a day (BID) | INTRAVENOUS | Status: AC

## 2020-04-27 NOTE — Addendum Note (Signed)
Addended by: Kathyrn Lass on: 04/27/2020 12:33 PM   Modules accepted: Orders

## 2020-04-27 NOTE — Patient Instructions (Addendum)
Medication Instructions:  Continue same medications *If you need a refill on your cardiac medications before your next appointment, please call your pharmacy*   Lab Work: Bmet,cbc today   Testing/Procedures: Right Cardiac scheduled   Follow instructions below   Follow-Up: At [facility], you and your health needs are our priority.  As part of our continuing mission to provide you with exceptional heart care, we have created designated Provider Care Teams.  These Care Teams include your primary Cardiologist (physician) and Advanced Practice Providers (APPs -  Physician Assistants and Nurse Practitioners) who all work together to provide you with the care you need, when you need it.  We recommend signing up for the patient portal called "MyChart".  Sign up information is provided on this After Visit Summary.  MyChart is used to connect with patients for Virtual Visits (Telemedicine).  Patients are able to view lab/test results, encounter notes, upcoming appointments, etc.  Non-urgent messages can be sent to your provider as well.   To learn more about what you can do with MyChart, go to [email or web address].    Your next appointment:  Tuesday 9/14 at 11:40 am   The format for your next appointment: Office   Provider: [place] [address] Dept: [phone] Loc: [place]  04/27/2020  You are scheduled for a Cardiac Cath on Tuesday 8/31, with Dr.Jordan.  1. Please arrive at the [facility] (Main Entrance A) at [facility]: [address] [place],  [address] at 8:30 am (This time is two hours before your procedure to ensure your preparation). Free valet parking service is available.   Special note: Every effort is made to have your procedure done on time. Please understand that emergencies sometimes delay  scheduled procedures.  2. Diet: Do not eat solid foods after midnight.  The patient may have clear liquids until 5am upon the day of the procedure.  3. Labs: You will need to have blood drawn on Thurs 8/26 at [place] office You do not need to be fasting.  Covid Test   Saturday 8/28 at 11:50 am at [place] after until cath.  4. Medication instructions in preparation for your procedure:       On the morning of your procedure, take Aspirin 81 mg and any morning medicines NOT listed above.  You may use sips of water.  5. Plan for one night stay--bring personal belongings. 6. Bring a current list of your medications and current insurance cards. 7. You MUST have a responsible person to drive you home. 8. Someone MUST be with you the first 24 hours after you arrive home or your discharge will be delayed. 9. Please wear clothes that are easy to get on and off and wear slip-on shoes.  Thank you for allowing [place] to care for you!   -- [facility]

## 2020-04-27 NOTE — H&P (View-Only) (Signed)
Cardiology Office Note   Date:  04/27/2020   ID:  Steven Kirby, DOB Sep 11, 1965, MRN [id number]  PCP:  Maury Dus, MD  Cardiologist:   Leslieanne Cobarrubias Martinique, MD   Chief Complaint  Patient presents with  . Shortness of Breath      History of Present Illness: Steven Kirby is a 54 y.o. male who is seen for follow up of DOE. He has a history of OSA. He reports that over the past 8-12 months he notes he gets winded easily with exertion. Denies any chest pain or pressure. Does not have SOB at rest. No cough, edema, palpitations. 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 He states he can walk on a treadmill at 3.4 mph for 30 minutes without any problem. Reports his sleep apnea is mild. Does not use CPAP since he states he would subconsciously remove it at night anyway.  To evaluate his symptoms further we performed a Myoview study. He was able to walk for 11 minutes with maximum mets of 11.3. Ecg showed a baseline RBBB and  ST depression in the inferior and lateral leads  but normal perfusion and EF 54%. Felt to be a normal study. Echo showed normal LV and RV function with mild pulmonary HTN. He was seen by Dr Ander Slade with pulmonary. Home sleep study was ordered. Recommended right heart cath to evaluate his pulmonary HTN.     Past Medical History:  Diagnosis Date  . Allergic rhinitis   . Anxiety   . Anxiety   . Chronic low back pain   . Chronic sinusitis   . Hemorrhage of rectum and anus   . Hemorrhoids   . History of MRSA infection   . OSA (obstructive sleep apnea)     Past Surgical History:  Procedure Laterality Date  . COLONOSCOPY    . LUMBAR LAMINECTOMY/DECOMPRESSION MICRODISCECTOMY Right 03/17/2015   Procedure: Right Lumbar Four- Five Far Lateral Diskectomy;  Surgeon: Ashok Pall, MD;  Location: [facility];  Service: Neurosurgery;  Laterality: Right;  Right L45 far lateral diskectomy  . MOLE  REMOVAL    . VASECTOMY       Current Outpatient Medications  Medication Sig Dispense Refill  . cetirizine (ZYRTEC) 10 MG tablet Take by mouth.     No current facility-administered medications for this visit.    Allergies:   Patient has no known allergies.    Social History:  The patient  reports that he has never smoked. He has never used smokeless tobacco. He reports that he does not drink alcohol and does not use drugs. 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    ROS:  Please see the history of present illness.   Otherwise, review of systems are positive for none.   All other systems are reviewed and negative.    PHYSICAL EXAM: VS:  BP 120/70   Pulse (!) 58   Ht 5\' 6"  (1.676 m)   Wt 211 lb 6.4 oz (95.9 kg)   SpO2 98%   BMI 34.12 kg/m  , BMI Body mass index is 34.12 kg/m. GEN: Well nourished, obese, in no acute distress  HEENT: normal  Neck: no JVD, carotid bruits, or masses Cardiac: RRR; no murmurs, rubs, or gallops,no edema  Respiratory:  clear to auscultation bilaterally, normal work of breathing GI: soft, nontender, nondistended, + BS MS: no deformity or atrophy  Skin: warm and dry, no rash Neuro:  Strength and sensation are intact Psych: euthymic mood, full affect   EKG:  EKG is not ordered today.    Recent Labs: No results found for requested labs within last 8760 hours.    Lipid Panel No results found for: CHOL, TRIG, HDL, CHOLHDL, VLDL, LDLCALC, LDLDIRECT   Labs dated 12/27/19: cholesterol 149, triglycerides 75, HDL 54, LDL 80. A1c 5.6%. creatinine 1.01. other Chemistries, CBC, and TSH normal.  Wt Readings from Last 3 Encounters:  04/27/20 211 lb 6.4 oz (95.9 kg)  04/25/20 212 lb 12.8 oz (96.5 kg)    01/25/20 214 lb (97.1 kg)      Other studies Reviewed: Additional studies/ records that were reviewed today include: none  Myoview 01/25/20: Study Highlights    The left ventricular ejection fraction is mildly decreased (45-54%).  Nuclear stress EF: 54%.  Blood pressure demonstrated a normal response to exercise.  Downsloping ST segment depression ST segment depression of 2 mm was noted during stress in the II, aVF, V6, V5, V4, V3 and III leads, and returning to baseline after less than 1 minute of recovery.  This is a low risk study.   No reversible ischemia. Abnormal EKG changes with exercise, including inferior and lateral ST depression likely d/t underlying RBBB. LVEF 54% with normal wall motion. This is a low risk study.  Echo 02/14/20: IMPRESSIONS    1. Left ventricular ejection fraction, by estimation, is 60 to 65%. The  left ventricle has normal function. The left ventricle has no regional  wall motion abnormalities. There is mild left ventricular hypertrophy of  the basal-septal segment. Left  ventricular diastolic parameters were normal.  2. Right ventricular systolic function is normal. The right ventricular  size is normal. There is mildly elevated pulmonary artery systolic  pressure.  3. The mitral valve is normal in structure. Trivial mitral valve  regurgitation. No evidence of mitral stenosis.  4. The aortic valve is normal in structure. Aortic valve regurgitation is  not visualized. No aortic stenosis is present.  5. The inferior vena cava is normal in size with greater than 50%  respiratory variability, suggesting right atrial pressure of 3 mmHg.   ASSESSMENT AND PLAN:  1.  Dyspnea on exertion. This may be related to his obesity and OSA. Mild pulmonary HTN on Echo. No evidence of ischemia and LV function is normal. Discussed the rationale for performing a right heart cath to directly assess pulmonary pressures. Procedure and risk discussed in detail.  Patient is agreeable to proceed.  2. RBBB. Noted in 2016 but not 2007. Explained that this is felt to be a benign finding. 3. Obesity with OSA. Await home sleep study.   Current medicines are reviewed at length with the patient today.  The patient does not have concerns regarding medicines.  The following changes have been made:  no change  Labs/ tests ordered today include:   No orders of the defined types were placed in this encounter.    Disposition:   FU TBD   Signed, Olukemi Panchal Martinique, MD  04/27/2020 12:02 PM    [facility] [address], [place], [place], [address] Phone [phone], Fax [phone]

## 2020-04-27 NOTE — Progress Notes (Signed)
Cardiology Office Note   Date:  04/27/2020   ID:  Steven Kirby, DOB [birth year], MRN [id number]  PCP:  Maury Dus, MD  Cardiologist:   Dyesha Henault Martinique, MD   Chief Complaint  Patient presents with  . Shortness of Breath      History of Present Illness: Steven Kirby is a 54 y.o. male who is seen for follow up of DOE. He has a history of OSA. He reports that over the past 8-12 months he notes he gets winded easily with exertion. Denies any chest pain or pressure. Does not have SOB at rest. No cough, edema, palpitations. 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 He states he can walk on a treadmill at 3.4 mph for 30 minutes without any problem. Reports his sleep apnea is mild. Does not use CPAP since he states he would subconsciously remove it at night anyway.  To evaluate his symptoms further we performed a Myoview study. He was able to walk for 11 minutes with maximum mets of 11.3. Ecg showed a baseline RBBB and  ST depression in the inferior and lateral leads  but normal perfusion and EF 54%. Felt to be a normal study. Echo showed normal LV and RV function with mild pulmonary HTN. He was seen by Dr Ander Slade with pulmonary. Home sleep study was ordered. Recommended right heart cath to evaluate his pulmonary HTN.     Past Medical History:  Diagnosis Date  . Allergic rhinitis   . Anxiety   . Anxiety   . Chronic low back pain   . Chronic sinusitis   . Hemorrhage of rectum and anus   . Hemorrhoids   . History of MRSA infection   . OSA (obstructive sleep apnea)     Past Surgical History:  Procedure Laterality Date  . COLONOSCOPY    . LUMBAR LAMINECTOMY/DECOMPRESSION MICRODISCECTOMY Right 03/17/2015   Procedure: Right Lumbar Four- Five Far Lateral Diskectomy;  Surgeon: Ashok Pall, MD;  Location: [facility];  Service: Neurosurgery;  Laterality: Right;  Right L45 far lateral diskectomy  . MOLE  REMOVAL    . VASECTOMY       Current Outpatient Medications  Medication Sig Dispense Refill  . cetirizine (ZYRTEC) 10 MG tablet Take by mouth.     No current facility-administered medications for this visit.    Allergies:   Patient has no known allergies.    Social History:  The patient  reports that he has never smoked. He has never used smokeless tobacco. He reports that he does not drink alcohol and does not use drugs. 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    ROS:  Please see the history of present illness.   Otherwise, review of systems are positive for none.   All other systems are reviewed and negative.    PHYSICAL EXAM: VS:  BP 120/70   Pulse (!) 58   Ht 5\' 6"  (1.676 m)   Wt 211 lb 6.4 oz (95.9 kg)   SpO2 98%   BMI 34.12 kg/m  , BMI Body mass index is 34.12 kg/m. GEN: Well nourished, obese, in no acute distress  HEENT: normal  Neck: no JVD, carotid bruits, or masses Cardiac: RRR; no murmurs, rubs, or gallops,no edema  Respiratory:  clear to auscultation bilaterally, normal work of breathing GI: soft, nontender, nondistended, + BS MS: no deformity or atrophy  Skin: warm and dry, no rash Neuro:  Strength and sensation are intact Psych: euthymic mood, full affect   EKG:  EKG is not ordered today.    Recent Labs: No results found for requested labs within last 8760 hours.    Lipid Panel No results found for: CHOL, TRIG, HDL, CHOLHDL, VLDL, LDLCALC, LDLDIRECT   Labs dated 12/27/19: cholesterol 149, triglycerides 75, HDL 54, LDL 80. A1c 5.6%. creatinine 1.01. other Chemistries, CBC, and TSH normal.  Wt Readings from Last 3 Encounters:  04/27/20 211 lb 6.4 oz (95.9 kg)  04/25/20 212 lb 12.8 oz (96.5 kg)    01/25/20 214 lb (97.1 kg)      Other studies Reviewed: Additional studies/ records that were reviewed today include: none  Myoview 01/25/20: Study Highlights    The left ventricular ejection fraction is mildly decreased (45-54%).  Nuclear stress EF: 54%.  Blood pressure demonstrated a normal response to exercise.  Downsloping ST segment depression ST segment depression of 2 mm was noted during stress in the II, aVF, V6, V5, V4, V3 and III leads, and returning to baseline after less than 1 minute of recovery.  This is a low risk study.   No reversible ischemia. Abnormal EKG changes with exercise, including inferior and lateral ST depression likely d/t underlying RBBB. LVEF 54% with normal wall motion. This is a low risk study.  Echo 02/14/20: IMPRESSIONS    1. Left ventricular ejection fraction, by estimation, is 60 to 65%. The  left ventricle has normal function. The left ventricle has no regional  wall motion abnormalities. There is mild left ventricular hypertrophy of  the basal-septal segment. Left  ventricular diastolic parameters were normal.  2. Right ventricular systolic function is normal. The right ventricular  size is normal. There is mildly elevated pulmonary artery systolic  pressure.  3. The mitral valve is normal in structure. Trivial mitral valve  regurgitation. No evidence of mitral stenosis.  4. The aortic valve is normal in structure. Aortic valve regurgitation is  not visualized. No aortic stenosis is present.  5. The inferior vena cava is normal in size with greater than 50%  respiratory variability, suggesting right atrial pressure of 3 mmHg.   ASSESSMENT AND PLAN:  1.  Dyspnea on exertion. This may be related to his obesity and OSA. Mild pulmonary HTN on Echo. No evidence of ischemia and LV function is normal. Discussed the rationale for performing a right heart cath to directly assess pulmonary pressures. Procedure and risk discussed in detail.  Patient is agreeable to proceed.  2. RBBB. Noted in 2016 but not 2007. Explained that this is felt to be a benign finding. 3. Obesity with OSA. Await home sleep study.   Current medicines are reviewed at length with the patient today.  The patient does not have concerns regarding medicines.  The following changes have been made:  no change  Labs/ tests ordered today include:   No orders of the defined types were placed in this encounter.    Disposition:   FU TBD   Signed, Kiven Vangilder Martinique, MD  04/27/2020 12:02 PM    [facility] [address], [place], [place], [address] Phone [phone], Fax [phone]

## 2020-04-28 LAB — CBC WITH DIFFERENTIAL/PLATELET
Basophils Absolute: 0.1 10*3/uL (ref 0.0–0.2)
Basos: 1 %
EOS (ABSOLUTE): 0.1 10*3/uL (ref 0.0–0.4)
Eos: 2 %
Hematocrit: 45.4 % (ref 37.5–51.0)
Hemoglobin: 15.6 g/dL (ref 13.0–17.7)
Immature Grans (Abs): 0 10*3/uL (ref 0.0–0.1)
Immature Granulocytes: 0 %
Lymphocytes Absolute: 2.8 10*3/uL (ref 0.7–3.1)
Lymphs: 45 %
MCH: 28.5 pg (ref 26.6–33.0)
MCHC: 34.4 g/dL (ref 31.5–35.7)
MCV: 83 fL (ref 79–97)
Monocytes Absolute: 0.4 10*3/uL (ref 0.1–0.9)
Monocytes: 7 %
Neutrophils Absolute: 2.9 10*3/uL (ref 1.4–7.0)
Neutrophils: 45 %
Platelets: 305 10*3/uL (ref 150–450)
RBC: 5.47 x10E6/uL (ref 4.14–5.80)
RDW: 12.7 % (ref 11.6–15.4)
WBC: 6.2 10*3/uL (ref 3.4–10.8)

## 2020-04-28 LAB — BASIC METABOLIC PANEL
BUN/Creatinine Ratio: 13 (ref 9–20)
BUN: 15 mg/dL (ref 6–24)
CO2: 27 mmol/L (ref 20–29)
Calcium: 10 mg/dL (ref 8.7–10.2)
Chloride: 103 mmol/L (ref 96–106)
Creatinine, Ser: 1.12 mg/dL (ref 0.76–1.27)
GFR calc Af Amer: 86 mL/min/{1.73_m2} (ref >59)
GFR calc non Af Amer: 74 mL/min/{1.73_m2} (ref >59)
Glucose: 81 mg/dL (ref 65–99)
Potassium: 5 mmol/L (ref 3.5–5.2)
Sodium: 142 mmol/L (ref 134–144)

## 2020-04-29 ENCOUNTER — Other Ambulatory Visit (HOSPITAL_COMMUNITY)
Admission: RE | Admit: 2020-04-29 | Discharge: 2020-04-29 | Disposition: A | Discharge status: Home / Self Care | Payer: BC Managed Care – PPO | Source: Ambulatory Visit | Attending: Cardiology | Admitting: Cardiology

## 2020-04-29 DIAGNOSIS — Z01812 Encounter for preprocedural laboratory examination: Secondary | ICD-10-CM | POA: Diagnosis not present

## 2020-04-29 DIAGNOSIS — Z20822 Contact with and (suspected) exposure to covid-19: Secondary | ICD-10-CM | POA: Insufficient documentation

## 2020-04-29 LAB — SARS CORONAVIRUS 2 (TAT 6-24 HRS): SARS Coronavirus 2: NEGATIVE

## 2020-05-01 ENCOUNTER — Telehealth: Payer: Self-pay | Admitting: *Deleted

## 2020-05-01 NOTE — Telephone Encounter (Signed)
Pt contacted pre-right heart catheterization scheduled at [facility] for: Tuesday May 02, 2020 10:30 AM Verified arrival time and place: [facility]) at: 8:30 AM    No solid food after midnight prior to cath, clear liquids until 5 AM day of procedure.   AM meds can be  taken pre-cath with sips of water.    Confirmed patient has responsible adult to drive home post procedure and observe 24 hours after arriving home: yes  You are allowed ONE visitor in the waiting room during the time you are at the hospital for your procedure. Both you and your visitor must wear a mask once you enter the hospital.       COVID-19 Pre-Screening Questions:   In the past 10 days have you had a new cough, shortness of breath, headache, congestion, fever (100 or greater) unexplained body aches, new sore throat, or sudden loss of taste or sense of smell? no  In the past 10 days have you been around anyone with known Covid 19?  no  Have you been vaccinated for COVID-19? no   Reviewed procedure/mask/visitor instructions, COVID-19 questions with patient.

## 2020-05-02 ENCOUNTER — Encounter (HOSPITAL_COMMUNITY)
Admission: RE | Disposition: A | Discharge status: Home / Self Care | Payer: Self-pay | Source: Home / Self Care | Attending: Cardiology

## 2020-05-02 ENCOUNTER — Encounter (HOSPITAL_COMMUNITY): Payer: Self-pay | Admitting: Cardiology

## 2020-05-02 ENCOUNTER — Ambulatory Visit (HOSPITAL_COMMUNITY)
Admission: RE | Admit: 2020-05-02 | Discharge: 2020-05-02 | Disposition: A | Discharge status: Home / Self Care | Payer: BC Managed Care – PPO | Attending: Cardiology | Admitting: Cardiology

## 2020-05-02 DIAGNOSIS — Z6834 Body mass index (BMI) 34.0-34.9, adult: Secondary | ICD-10-CM | POA: Diagnosis not present

## 2020-05-02 DIAGNOSIS — R0600 Dyspnea, unspecified: Secondary | ICD-10-CM | POA: Diagnosis not present

## 2020-05-02 DIAGNOSIS — E669 Obesity, unspecified: Secondary | ICD-10-CM | POA: Diagnosis not present

## 2020-05-02 DIAGNOSIS — R0609 Other forms of dyspnea: Secondary | ICD-10-CM | POA: Insufficient documentation

## 2020-05-02 DIAGNOSIS — Z8249 Family history of ischemic heart disease and other diseases of the circulatory system: Secondary | ICD-10-CM | POA: Insufficient documentation

## 2020-05-02 DIAGNOSIS — I4510 Unspecified right bundle-branch block: Secondary | ICD-10-CM | POA: Diagnosis not present

## 2020-05-02 DIAGNOSIS — G4733 Obstructive sleep apnea (adult) (pediatric): Secondary | ICD-10-CM | POA: Diagnosis not present

## 2020-05-02 DIAGNOSIS — Z8349 Family history of other endocrine, nutritional and metabolic diseases: Secondary | ICD-10-CM | POA: Diagnosis not present

## 2020-05-02 DIAGNOSIS — I272 Pulmonary hypertension, unspecified: Secondary | ICD-10-CM | POA: Diagnosis not present

## 2020-05-02 DIAGNOSIS — Z8614 Personal history of Methicillin resistant Staphylococcus aureus infection: Secondary | ICD-10-CM | POA: Insufficient documentation

## 2020-05-02 HISTORY — PX: RIGHT HEART CATH: CATH118263

## 2020-05-02 LAB — POCT I-STAT EG7
Acid-Base Excess: 3 mmol/L — ABNORMAL HIGH (ref 0.0–2.0)
Bicarbonate: 28.4 mmol/L — ABNORMAL HIGH (ref 20.0–28.0)
Calcium, Ion: 1.27 mmol/L (ref 1.15–1.40)
HCT: 44 % (ref 39.0–52.0)
Hemoglobin: 15 g/dL (ref 13.0–17.0)
O2 Saturation: 70 %
Potassium: 4.1 mmol/L (ref 3.5–5.1)
Sodium: 141 mmol/L (ref 135–145)
TCO2: 30 mmol/L (ref 22–32)
pCO2, Ven: 45.3 mmHg (ref 44.0–60.0)
pH, Ven: 7.404 (ref 7.250–7.430)
pO2, Ven: 37 mmHg (ref 32.0–45.0)

## 2020-05-02 SURGERY — RIGHT HEART CATH
Anesthesia: LOCAL

## 2020-05-02 MED ORDER — SODIUM CHLORIDE 0.9% FLUSH: 3.0000 mL | INTRAVENOUS | Status: DC | PRN

## 2020-05-02 MED ORDER — SODIUM CHLORIDE 0.9 % IV SOLN: 250.0000 mL | INTRAVENOUS | Status: DC | PRN

## 2020-05-02 MED ORDER — VERAPAMIL HCL 2.5 MG/ML IV SOLN
INTRAVENOUS | Status: AC
  Filled 2020-05-02: qty 2

## 2020-05-02 MED ORDER — SODIUM CHLORIDE 0.9% FLUSH: 3.0000 mL | Freq: Two times a day (BID) | INTRAVENOUS | Status: DC

## 2020-05-02 MED ORDER — SODIUM CHLORIDE 0.9 % IV SOLN: INTRAVENOUS | Status: AC

## 2020-05-02 MED ORDER — HEPARIN (PORCINE) IN NACL 1000-0.9 UT/500ML-% IV SOLN
INTRAVENOUS | Status: AC
  Filled 2020-05-02: qty 500

## 2020-05-02 MED ORDER — ACETAMINOPHEN 325 MG PO TABS: 650.0000 mg | ORAL_TABLET | ORAL | Status: DC | PRN

## 2020-05-02 MED ORDER — ONDANSETRON HCL 4 MG/2ML IJ SOLN: 4.0000 mg | Freq: Four times a day (QID) | INTRAMUSCULAR | Status: DC | PRN

## 2020-05-02 MED ORDER — HEPARIN (PORCINE) IN NACL 1000-0.9 UT/500ML-% IV SOLN
INTRAVENOUS | Status: AC
  Filled 2020-05-02: qty 1000

## 2020-05-02 MED ORDER — SODIUM CHLORIDE 0.9 % IV SOLN: INTRAVENOUS | Status: DC

## 2020-05-02 MED ORDER — LIDOCAINE HCL (PF) 1 % IJ SOLN
INTRAMUSCULAR | Status: AC
  Filled 2020-05-02: qty 30

## 2020-05-02 MED ORDER — LIDOCAINE HCL (PF) 1 % IJ SOLN
INTRAMUSCULAR | Status: DC | PRN
  Administered 2020-05-02: 2 mL via INTRADERMAL

## 2020-05-02 MED ORDER — HEPARIN (PORCINE) IN NACL 1000-0.9 UT/500ML-% IV SOLN
INTRAVENOUS | Status: DC | PRN
  Administered 2020-05-02: 500 mL

## 2020-05-02 SURGICAL SUPPLY — 4 items
CATH BALLN WEDGE 5F 110CM (CATHETERS) ×1 IMPLANT
PACK CARDIAC CATHETERIZATION (CUSTOM PROCEDURE TRAY) ×1 IMPLANT
SHEATH GLIDE SLENDER 4/5FR (SHEATH) ×1 IMPLANT
TRANSDUCER W/STOPCOCK (MISCELLANEOUS) ×1 IMPLANT

## 2020-05-02 NOTE — Interval H&P Note (Signed)
History and Physical Interval Note:  05/02/2020 10:52 AM  Steven Kirby  has presented today for surgery, with the diagnosis of Shortness of Breath.  The various methods of treatment have been discussed with the patient and family. After consideration of risks, benefits and other options for treatment, the patient has consented to  Procedure(s): RIGHT HEART CATH (N/A) as a surgical intervention.  The patient's history has been reviewed, patient examined, no change in status, stable for surgery.  I have reviewed the patient's chart and labs.  Questions were answered to the patient's satisfaction.     [name] [facility] 05/02/2020 10:52 AM

## 2020-05-02 NOTE — Discharge Instructions (Signed)
Drink plenty of fluid for 48 hours and keep wrist elevated at heart level for 24 hours  Radial Site Care   This sheet gives you information about how to care for yourself after your procedure. Your health care provider may also give you more specific instructions. If you have problems or questions, contact your health care provider. What can I expect after the procedure? After the procedure, it is common to have:  Bruising and tenderness at the catheter insertion area. Follow these instructions at home: Medicines  Take over-the-counter and prescription medicines only as told by your health care provider. Insertion site care 1. Follow instructions from your health care provider about how to take care of your insertion site. Make sure you: ? Wash your hands with soap and water before you change your bandage (dressing). If soap and water are not available, use hand sanitizer. ? remove your dressing as told by your health care provider. In 24 hours 2. Check your insertion site every day for signs of infection. Check for: ? Redness, swelling, or pain. ? Fluid or blood. ? Pus or a bad smell. ? Warmth. 3. Do not take baths, swim, or use a hot tub until your health care provider approves. 4. You may shower 24-48 hours after the procedure, or as directed by your health care provider. ? Remove the dressing and gently wash the site with plain soap and water. ? Pat the area dry with a clean towel. ? Do not rub the site. That could cause bleeding. 5. Do not apply powder or lotion to the site. Activity   1. For 24 hours after the procedure, or as directed by your health care provider: ? Do not flex or bend the affected arm. ? Do not push or pull heavy objects with the affected arm. ? Do not drive yourself home from the hospital or clinic. You may drive 24 hours after the procedure unless your health care provider tells you not to. ? Do not operate machinery or power tools. 2. Do not lift  anything that is heavier than 10 lb (4.5 kg), or the limit that you are told, until your health care provider says that it is safe. For 4 days 3. Ask your health care provider when it is okay to: ? Return to work or school. ? Resume usual physical activities or sports. ? Resume sexual activity. General instructions  If the catheter site starts to bleed, raise your arm and put firm pressure on the site. If the bleeding does not stop, get help right away. This is a medical emergency.  If you went home on the same day as your procedure, a responsible adult should be with you for the first 24 hours after you arrive home.  Keep all follow-up visits as told by your health care provider. This is important. Contact a health care provider if:  You have a fever.  You have redness, swelling, or yellow drainage around your insertion site. Get help right away if:  You have unusual pain at the radial site.  The catheter insertion area swells very fast.  The insertion area is bleeding, and the bleeding does not stop when you hold steady pressure on the area.  Your arm or hand becomes pale, cool, tingly, or numb. These symptoms may represent a serious problem that is an emergency. Do not wait to see if the symptoms will go away. Get medical help right away. Call your local emergency services (911 in the [place] Do not   drive yourself to the hospital. Summary  After the procedure, it is common to have bruising and tenderness at the site.  Follow instructions from your health care provider about how to take care of your radial site wound. Check the wound every day for signs of infection.  Do not lift anything that is heavier than 10 lb (4.5 kg), or the limit that you are told, until your health care provider says that it is safe. This information is not intended to replace advice given to you by your health care provider. Make sure you discuss any questions you have with your health care  provider. Document Revised: 09/24/2017 Document Reviewed: 09/24/2017 Elsevier Patient Education  2020 [employer] Inc[employer]  

## 2020-05-02 NOTE — Research (Signed)
[place] Informed Consent   Subject Name: Steven Kirby  Subject met inclusion and exclusion criteria.  The informed consent form, study requirements and expectations were reviewed with the subject and questions and concerns were addressed prior to the signing of the consent form.  The subject verbalized understanding of the trial requirements.  The subject agreed to participate in the [facility] trial and signed the informed consent at 0925 on 05/02/2020.  The informed consent was obtained prior to performance of any protocol-specific procedures for the subject.  A copy of the signed informed consent was given to the subject and a copy was placed in the subject's medical record.   Pharrah Rottman

## 2020-05-14 NOTE — Progress Notes (Deleted)
Cardiology Office Note   Date:  05/14/2020   ID:  Steven Kirby, DOB 12-21-1965, MRN [id number]  PCP:  Maury Dus, MD  Cardiologist:   Ameenah Prosser Martinique, MD   No chief complaint on file.     History of Present Illness: Steven Kirby is a 54 y.o. male who is seen for follow up of DOE. He has a history of OSA. He reports that over the past 8-12 months he notes he gets winded easily with exertion. Denies any chest pain or pressure. Does not have SOB at rest. No cough, edema, palpitations. 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 He states he can walk on a treadmill at 3.4 mph for 30 minutes without any problem. Reports his sleep apnea is mild. Does not use CPAP since he states he would subconsciously remove it at night anyway.  To evaluate his symptoms further we performed a Myoview study. He was able to walk for 11 minutes with maximum mets of 11.3. Ecg showed a baseline RBBB and  ST depression in the inferior and lateral leads  but normal perfusion and EF 54%. Felt to be a normal study. Echo showed normal LV and RV function with mild pulmonary HTN. He was seen by Dr Ander Slade with pulmonary. Home sleep study was ordered. Recommended right heart cath to evaluate his pulmonary HTN. Right heart cath showed normal LV filling pressures and normal right heart pressures.     Past Medical History:  Diagnosis Date  . Allergic rhinitis   . Anxiety   . Anxiety   . Chronic low back pain   . Chronic sinusitis   . Hemorrhage of rectum and anus   . Hemorrhoids   . History of MRSA infection   . OSA (obstructive sleep apnea)     Past Surgical History:  Procedure Laterality Date  . COLONOSCOPY    . LUMBAR LAMINECTOMY/DECOMPRESSION MICRODISCECTOMY Right 03/17/2015   Procedure: Right Lumbar Four- Five Far Lateral Diskectomy;  Surgeon: Ashok Pall, MD;  Location: [facility];  Service: Neurosurgery;  Laterality:  Right;  Right L45 far lateral diskectomy  . MOLE REMOVAL    . RIGHT HEART CATH N/A 05/02/2020   Procedure: RIGHT HEART CATH;  Surgeon: Martinique, Nakeeta Sebastiani M, MD;  Location: [facility];  Service: Cardiovascular;  Laterality: N/A;  . VASECTOMY       Current Outpatient Medications  Medication Sig Dispense Refill  . cetirizine (ZYRTEC) 10 MG tablet Take 10 mg by mouth daily.      Current Facility-Administered Medications  Medication Dose Route Frequency Provider Last Rate Last Admin  . sodium chloride flush (NS) 0.9 % injection 3 mL  3 mL Intravenous Q12H Martinique, Derrick Tiegs M, MD        Allergies:   Patient has no known allergies.    Social History:  The patient  reports that he has never smoked. He has never used smokeless tobacco. He reports that he does not drink alcohol and does not use drugs. 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    ROS:  Please see the history of present illness.   Otherwise, review of systems are positive  for none.   All other systems are reviewed and negative.    PHYSICAL EXAM: VS:  There were no vitals taken for this visit. , BMI There is no height or weight on file to calculate BMI. GEN: Well nourished, obese, in no acute distress  HEENT: normal  Neck: no JVD, carotid bruits, or masses Cardiac: RRR; no murmurs, rubs, or gallops,no edema  Respiratory:  clear to auscultation bilaterally, normal work of breathing GI: soft, nontender, nondistended, + BS MS: no deformity or atrophy  Skin: warm and dry, no rash Neuro:  Strength and sensation are intact Psych: euthymic mood, full affect   EKG:  EKG is not ordered today.    Recent Labs: 04/27/2020: BUN 15; Creatinine, Ser 1.12; Platelets 305 05/02/2020: Hemoglobin  15.0; Potassium 4.1; Sodium 141    Lipid Panel No results found for: CHOL, TRIG, HDL, CHOLHDL, VLDL, LDLCALC, LDLDIRECT   Labs dated 12/27/19: cholesterol 149, triglycerides 75, HDL 54, LDL 80. A1c 5.6%. creatinine 1.01. other Chemistries, CBC, and TSH normal.  Wt Readings from Last 3 Encounters:  05/02/20 211 lb (95.7 kg)  04/27/20 211 lb 6.4 oz (95.9 kg)  04/25/20 212 lb 12.8 oz (96.5 kg)      Other studies Reviewed: Additional studies/ records that were reviewed today include: none  Myoview 01/25/20: Study Highlights    The left ventricular ejection fraction is mildly decreased (45-54%).  Nuclear stress EF: 54%.  Blood pressure demonstrated a normal response to exercise.  Downsloping ST segment depression ST segment depression of 2 mm was noted during stress in the II, aVF, V6, V5, V4, V3 and III leads, and returning to baseline after less than 1 minute of recovery.  This is a low risk study.   No reversible ischemia. Abnormal EKG changes with exercise, including inferior and lateral ST depression likely d/t underlying RBBB. LVEF 54% with normal wall motion. This is a low risk study.  Echo 02/14/20: IMPRESSIONS    1. Left ventricular ejection fraction, by estimation, is 60 to 65%. The  left ventricle has normal function. The left ventricle has no regional  wall motion abnormalities. There is mild left ventricular hypertrophy of  the basal-septal segment. Left  ventricular diastolic parameters were normal.  2. Right ventricular systolic function is normal. The right ventricular  size is normal. There is mildly elevated pulmonary artery systolic  pressure.  3. The mitral valve is normal in structure. Trivial mitral valve  regurgitation. No evidence of mitral stenosis.  4. The aortic valve is normal in structure. Aortic valve regurgitation is  not visualized. No aortic stenosis is present.  5. The inferior vena cava is normal in size with greater than 50%    respiratory variability, suggesting right atrial pressure of 3 mmHg.   Right heart cath 05/02/20: Conclusion    LV end diastolic pressure is normal.  LV end diastolic pressure is normal.   1. Normal right heart pressures. Mean PAP 11 mm Hg 2. Normal LV filling pressure. PCWP 6 mm Hg 3. Normal cardiac output. 4.28 L/min  Plan: proceed with plans for sleep study. Regular aerobic exercise and weight loss.   ASSESSMENT AND PLAN:  1.  Dyspnea on exertion. This may be related to his obesity and OSA. Mild pulmonary HTN on Echo. No evidence of ischemia and LV function is normal. Discussed the rationale for performing a right heart cath to directly assess pulmonary pressures. Procedure and risk discussed in detail. Patient is agreeable to proceed.  2. RBBB. Noted in  2016 but not 2007. Explained that this is felt to be a benign finding. 3. Obesity with OSA. Await home sleep study.   Current medicines are reviewed at length with the patient today.  The patient does not have concerns regarding medicines.  The following changes have been made:  no change  Labs/ tests ordered today include:   No orders of the defined types were placed in this encounter.    Disposition:   FU TBD   Signed, Macyn Shropshire Martinique, MD  05/14/2020 11:02 AM    [facility] [address], [place], [place], [address] Phone [phone], Fax [phone]

## 2020-05-16 ENCOUNTER — Ambulatory Visit: Payer: BC Managed Care – PPO | Admitting: Cardiology

## 2020-05-25 ENCOUNTER — Ambulatory Visit: Payer: BC Managed Care – PPO

## 2020-05-25 ENCOUNTER — Other Ambulatory Visit: Payer: Self-pay

## 2020-05-25 DIAGNOSIS — R0600 Dyspnea, unspecified: Secondary | ICD-10-CM

## 2020-05-25 DIAGNOSIS — G4733 Obstructive sleep apnea (adult) (pediatric): Secondary | ICD-10-CM | POA: Diagnosis not present

## 2020-06-12 ENCOUNTER — Telehealth: Payer: Self-pay | Admitting: Pulmonary Disease

## 2020-06-12 DIAGNOSIS — G4733 Obstructive sleep apnea (adult) (pediatric): Secondary | ICD-10-CM | POA: Diagnosis not present

## 2020-06-12 NOTE — Telephone Encounter (Signed)
Dr. Ander Slade has reviewed the home sleep test this showed mild.   Recommendations   Treatment options are CPAP with the settings auto 5 to 15.    Weight loss measures .   Advise against driving while sleepy & against medication with sedative side effects.    Make appointment for 3 months for compliance with download with Dr. Ander Slade.    Pt was called given sleep study results and recommendations has agreed to cpap therapy and stated will call to schedule 4-6 weeks f/u after he is contacted by dme company. Pt has order placed in chart for cpap therapy

## 2020-06-12 NOTE — Telephone Encounter (Signed)
Call patient  Sleep study result  Date of study: 05/25/2020  Impression: Mild obstructive sleep apnea No significant oxygen desaturations  Recommendation: Options of treatment for mild sleep apnea includes   CPAP therapy may be considered for treatment of obstructive sleep apnea Recommend CPAP 5-15  Oral device may also be considered for treatment of mild sleep disordered breathing -This will involve referral to a dentist to fashion an oral device  Sleep position optimization by encouraging sleeping on lateral position, elevation of the head of the bed may help primary snoring   I will overall recommend CPAP therapy  Follow-up 4 to 6 weeks following initiation of treatment

## 2020-07-06 ENCOUNTER — Ambulatory Visit: Payer: BC Managed Care – PPO

## 2020-09-06 ENCOUNTER — Ambulatory Visit: Payer: BC Managed Care – PPO

## 2020-09-06 DIAGNOSIS — J069 Acute upper respiratory infection, unspecified: Secondary | ICD-10-CM | POA: Diagnosis not present

## 2020-12-14 DIAGNOSIS — J069 Acute upper respiratory infection, unspecified: Secondary | ICD-10-CM | POA: Diagnosis not present

## 2020-12-28 DIAGNOSIS — Z13220 Encounter for screening for lipoid disorders: Secondary | ICD-10-CM | POA: Diagnosis not present

## 2020-12-28 DIAGNOSIS — Z125 Encounter for screening for malignant neoplasm of prostate: Secondary | ICD-10-CM | POA: Diagnosis not present

## 2020-12-28 DIAGNOSIS — Z0000 Encounter for general adult medical examination without abnormal findings: Secondary | ICD-10-CM | POA: Diagnosis not present

## 2020-12-28 DIAGNOSIS — R030 Elevated blood-pressure reading, without diagnosis of hypertension: Secondary | ICD-10-CM | POA: Diagnosis not present

## 2021-05-08 ENCOUNTER — Ambulatory Visit: Payer: BC Managed Care – PPO | Admitting: Dermatology

## 2021-06-06 DIAGNOSIS — J029 Acute pharyngitis, unspecified: Secondary | ICD-10-CM | POA: Diagnosis not present

## 2021-06-06 DIAGNOSIS — J309 Allergic rhinitis, unspecified: Secondary | ICD-10-CM | POA: Diagnosis not present

## 2021-06-06 DIAGNOSIS — R0981 Nasal congestion: Secondary | ICD-10-CM | POA: Diagnosis not present

## 2021-06-27 DIAGNOSIS — H40013 Open angle with borderline findings, low risk, bilateral: Secondary | ICD-10-CM | POA: Diagnosis not present

## 2021-06-27 DIAGNOSIS — H2513 Age-related nuclear cataract, bilateral: Secondary | ICD-10-CM | POA: Diagnosis not present

## 2021-06-27 DIAGNOSIS — H25013 Cortical age-related cataract, bilateral: Secondary | ICD-10-CM | POA: Diagnosis not present

## 2021-06-27 DIAGNOSIS — H43393 Other vitreous opacities, bilateral: Secondary | ICD-10-CM | POA: Diagnosis not present

## 2021-07-21 DIAGNOSIS — Z209 Contact with and (suspected) exposure to unspecified communicable disease: Secondary | ICD-10-CM | POA: Diagnosis not present

## 2021-07-21 DIAGNOSIS — R000 Tachycardia, unspecified: Secondary | ICD-10-CM | POA: Diagnosis not present

## 2021-07-24 DIAGNOSIS — U071 COVID-19: Secondary | ICD-10-CM | POA: Diagnosis not present

## 2021-07-24 DIAGNOSIS — R000 Tachycardia, unspecified: Secondary | ICD-10-CM | POA: Diagnosis not present

## 2021-10-29 ENCOUNTER — Ambulatory Visit: Payer: BC Managed Care – PPO | Admitting: Dermatology

## 2021-11-22 ENCOUNTER — Ambulatory Visit: Payer: BC Managed Care – PPO | Admitting: Dermatology

## 2021-11-27 DIAGNOSIS — G4730 Sleep apnea, unspecified: Secondary | ICD-10-CM | POA: Diagnosis not present

## 2021-11-27 DIAGNOSIS — J019 Acute sinusitis, unspecified: Secondary | ICD-10-CM | POA: Diagnosis not present

## 2021-12-21 DIAGNOSIS — G4733 Obstructive sleep apnea (adult) (pediatric): Secondary | ICD-10-CM | POA: Diagnosis not present

## 2022-01-08 DIAGNOSIS — Z0000 Encounter for general adult medical examination without abnormal findings: Secondary | ICD-10-CM | POA: Diagnosis not present

## 2022-01-08 DIAGNOSIS — E669 Obesity, unspecified: Secondary | ICD-10-CM | POA: Diagnosis not present

## 2022-01-08 DIAGNOSIS — Z125 Encounter for screening for malignant neoplasm of prostate: Secondary | ICD-10-CM | POA: Diagnosis not present

## 2022-01-08 DIAGNOSIS — R030 Elevated blood-pressure reading, without diagnosis of hypertension: Secondary | ICD-10-CM | POA: Diagnosis not present

## 2022-01-21 DIAGNOSIS — G4733 Obstructive sleep apnea (adult) (pediatric): Secondary | ICD-10-CM | POA: Diagnosis not present

## 2022-01-30 DIAGNOSIS — Z03818 Encounter for observation for suspected exposure to other biological agents ruled out: Secondary | ICD-10-CM | POA: Diagnosis not present

## 2022-01-30 DIAGNOSIS — J069 Acute upper respiratory infection, unspecified: Secondary | ICD-10-CM | POA: Diagnosis not present

## 2022-01-30 DIAGNOSIS — R0981 Nasal congestion: Secondary | ICD-10-CM | POA: Diagnosis not present

## 2022-01-30 DIAGNOSIS — J029 Acute pharyngitis, unspecified: Secondary | ICD-10-CM | POA: Diagnosis not present

## 2022-01-30 DIAGNOSIS — R5383 Other fatigue: Secondary | ICD-10-CM | POA: Diagnosis not present

## 2022-01-30 DIAGNOSIS — H9203 Otalgia, bilateral: Secondary | ICD-10-CM | POA: Diagnosis not present

## 2022-02-21 DIAGNOSIS — G4733 Obstructive sleep apnea (adult) (pediatric): Secondary | ICD-10-CM | POA: Diagnosis not present

## 2022-02-22 DIAGNOSIS — B351 Tinea unguium: Secondary | ICD-10-CM | POA: Diagnosis not present

## 2022-03-23 DIAGNOSIS — G4733 Obstructive sleep apnea (adult) (pediatric): Secondary | ICD-10-CM | POA: Diagnosis not present

## 2022-04-09 DIAGNOSIS — G4733 Obstructive sleep apnea (adult) (pediatric): Secondary | ICD-10-CM | POA: Diagnosis not present

## 2022-06-03 ENCOUNTER — Ambulatory Visit: Payer: BC Managed Care – PPO | Admitting: Dermatology

## 2022-06-17 DIAGNOSIS — D120 Benign neoplasm of cecum: Secondary | ICD-10-CM | POA: Diagnosis not present

## 2022-06-17 DIAGNOSIS — K648 Other hemorrhoids: Secondary | ICD-10-CM | POA: Diagnosis not present

## 2022-06-17 DIAGNOSIS — Z1211 Encounter for screening for malignant neoplasm of colon: Secondary | ICD-10-CM | POA: Diagnosis not present

## 2022-07-03 DIAGNOSIS — H2513 Age-related nuclear cataract, bilateral: Secondary | ICD-10-CM | POA: Diagnosis not present

## 2022-07-03 DIAGNOSIS — H40013 Open angle with borderline findings, low risk, bilateral: Secondary | ICD-10-CM | POA: Diagnosis not present

## 2022-07-03 DIAGNOSIS — H524 Presbyopia: Secondary | ICD-10-CM | POA: Diagnosis not present

## 2022-07-03 DIAGNOSIS — H02831 Dermatochalasis of right upper eyelid: Secondary | ICD-10-CM | POA: Diagnosis not present

## 2022-07-03 DIAGNOSIS — H02834 Dermatochalasis of left upper eyelid: Secondary | ICD-10-CM | POA: Diagnosis not present

## 2022-07-03 DIAGNOSIS — H43393 Other vitreous opacities, bilateral: Secondary | ICD-10-CM | POA: Diagnosis not present

## 2022-08-23 DIAGNOSIS — Z03818 Encounter for observation for suspected exposure to other biological agents ruled out: Secondary | ICD-10-CM | POA: Diagnosis not present

## 2022-08-23 DIAGNOSIS — J069 Acute upper respiratory infection, unspecified: Secondary | ICD-10-CM | POA: Diagnosis not present

## 2022-09-06 ENCOUNTER — Other Ambulatory Visit (HOSPITAL_BASED_OUTPATIENT_CLINIC_OR_DEPARTMENT_OTHER): Payer: Self-pay | Admitting: Physician Assistant

## 2022-09-06 ENCOUNTER — Ambulatory Visit (HOSPITAL_BASED_OUTPATIENT_CLINIC_OR_DEPARTMENT_OTHER)
Admission: RE | Admit: 2022-09-06 | Discharge: 2022-09-06 | Disposition: A | Discharge status: Home / Self Care | Payer: BC Managed Care – PPO | Source: Ambulatory Visit | Attending: Physician Assistant | Admitting: Physician Assistant

## 2022-09-06 DIAGNOSIS — M7989 Other specified soft tissue disorders: Secondary | ICD-10-CM | POA: Insufficient documentation

## 2022-09-06 DIAGNOSIS — I82461 Acute embolism and thrombosis of right calf muscular vein: Secondary | ICD-10-CM | POA: Diagnosis not present

## 2023-01-16 DIAGNOSIS — R5383 Other fatigue: Secondary | ICD-10-CM | POA: Diagnosis not present

## 2023-01-16 DIAGNOSIS — Z13220 Encounter for screening for lipoid disorders: Secondary | ICD-10-CM | POA: Diagnosis not present

## 2023-01-16 DIAGNOSIS — Z125 Encounter for screening for malignant neoplasm of prostate: Secondary | ICD-10-CM | POA: Diagnosis not present

## 2023-01-16 DIAGNOSIS — R030 Elevated blood-pressure reading, without diagnosis of hypertension: Secondary | ICD-10-CM | POA: Diagnosis not present

## 2023-01-16 DIAGNOSIS — Z0000 Encounter for general adult medical examination without abnormal findings: Secondary | ICD-10-CM | POA: Diagnosis not present

## 2023-04-08 DIAGNOSIS — J329 Chronic sinusitis, unspecified: Secondary | ICD-10-CM | POA: Diagnosis not present

## 2023-06-24 DIAGNOSIS — D1801 Hemangioma of skin and subcutaneous tissue: Secondary | ICD-10-CM | POA: Diagnosis not present

## 2023-06-24 DIAGNOSIS — L821 Other seborrheic keratosis: Secondary | ICD-10-CM | POA: Diagnosis not present

## 2023-06-24 DIAGNOSIS — L570 Actinic keratosis: Secondary | ICD-10-CM | POA: Diagnosis not present

## 2023-06-24 DIAGNOSIS — L814 Other melanin hyperpigmentation: Secondary | ICD-10-CM | POA: Diagnosis not present

## 2023-06-24 DIAGNOSIS — D485 Neoplasm of uncertain behavior of skin: Secondary | ICD-10-CM | POA: Diagnosis not present

## 2023-06-24 DIAGNOSIS — L905 Scar conditions and fibrosis of skin: Secondary | ICD-10-CM | POA: Diagnosis not present

## 2023-06-24 DIAGNOSIS — C44519 Basal cell carcinoma of skin of other part of trunk: Secondary | ICD-10-CM | POA: Diagnosis not present

## 2023-07-07 DIAGNOSIS — H2513 Age-related nuclear cataract, bilateral: Secondary | ICD-10-CM | POA: Diagnosis not present

## 2023-07-07 DIAGNOSIS — H524 Presbyopia: Secondary | ICD-10-CM | POA: Diagnosis not present

## 2023-07-07 DIAGNOSIS — H5351 Achromatopsia: Secondary | ICD-10-CM | POA: Diagnosis not present

## 2023-07-07 DIAGNOSIS — H02822 Cysts of right lower eyelid: Secondary | ICD-10-CM | POA: Diagnosis not present

## 2023-07-07 DIAGNOSIS — H40013 Open angle with borderline findings, low risk, bilateral: Secondary | ICD-10-CM | POA: Diagnosis not present

## 2023-07-30 DIAGNOSIS — E6609 Other obesity due to excess calories: Secondary | ICD-10-CM | POA: Diagnosis not present

## 2023-07-30 DIAGNOSIS — M545 Low back pain, unspecified: Secondary | ICD-10-CM | POA: Diagnosis not present

## 2023-07-30 DIAGNOSIS — Z23 Encounter for immunization: Secondary | ICD-10-CM | POA: Diagnosis not present

## 2023-07-30 DIAGNOSIS — S63602A Unspecified sprain of left thumb, initial encounter: Secondary | ICD-10-CM | POA: Diagnosis not present

## 2023-08-21 DIAGNOSIS — C44519 Basal cell carcinoma of skin of other part of trunk: Secondary | ICD-10-CM | POA: Diagnosis not present

## 2023-09-09 DIAGNOSIS — J029 Acute pharyngitis, unspecified: Secondary | ICD-10-CM | POA: Diagnosis not present

## 2023-09-09 DIAGNOSIS — J3489 Other specified disorders of nose and nasal sinuses: Secondary | ICD-10-CM | POA: Diagnosis not present

## 2023-09-09 DIAGNOSIS — S60351A Superficial foreign body of right thumb, initial encounter: Secondary | ICD-10-CM | POA: Diagnosis not present

## 2023-11-20 DIAGNOSIS — S6992XA Unspecified injury of left wrist, hand and finger(s), initial encounter: Secondary | ICD-10-CM | POA: Diagnosis not present

## 2023-12-04 ENCOUNTER — Other Ambulatory Visit: Payer: Self-pay

## 2023-12-04 ENCOUNTER — Ambulatory Visit

## 2023-12-04 ENCOUNTER — Ambulatory Visit: Attending: Sports Medicine | Admitting: Occupational Therapy

## 2023-12-04 DIAGNOSIS — M6281 Muscle weakness (generalized): Secondary | ICD-10-CM | POA: Insufficient documentation

## 2023-12-04 DIAGNOSIS — M79645 Pain in left finger(s): Secondary | ICD-10-CM | POA: Insufficient documentation

## 2023-12-04 NOTE — Therapy (Signed)
 OUTPATIENT OCCUPATIONAL THERAPY ORTHO EVALUATION  Patient Name: Steven Kirby MRN: [id number] DOB:1966-05-25, 58 y.o., male Today's Date: 12/04/2023  PCP: Elias Else, MD REFERRING PROVIDER: Hurman Horn, MD  END OF SESSION:  OT End of Session - 12/04/23 1612     Visit Number 1    Number of Visits 13    Date for OT Re-Evaluation 01/30/24   asking for 8 weeks to accommodate scheduling   Authorization Type BCBS [id number]    Authorization Time Period auth submitted (30 visits combined OT/PT/ST)    OT Start Time 1317    OT Stop Time 1403    OT Time Calculation (min) 46 min             Past Medical History:  Diagnosis Date   Allergic rhinitis    Anxiety    Anxiety    Chronic low back pain    Chronic sinusitis    Hemorrhage of rectum and anus    Hemorrhoids    History of MRSA infection    OSA (obstructive sleep apnea)    Past Surgical History:  Procedure Laterality Date   COLONOSCOPY     LUMBAR LAMINECTOMY/DECOMPRESSION MICRODISCECTOMY Right 03/17/2015   Procedure: Right Lumbar Four- Five Far Lateral Diskectomy;  Surgeon: Coletta Memos, MD;  Location: [facility];  Service: Neurosurgery;  Laterality: Right;  Right L45 far lateral diskectomy   MOLE REMOVAL     RIGHT HEART CATH N/A 05/02/2020   Procedure: RIGHT HEART CATH;  Surgeon: Swazil[place], Peter M, MD;  Location: [facility] INVASIVE CV LAB;  Service: Cardiovascular;  Laterality: N/A;   VASECTOMY     Patient Active Problem List   Diagnosis Date Noted   Dyspnea on exertion 05/02/2020   Pre-syncope 11/04/2018   HNP (herniated nucleus pulposus), lumbar 03/17/2015   Obstructive sleep apnea on CPAP 06/07/2013   Obesity (BMI 30-39.9) 06/07/2013    ONSET DATE: referral 11/25/23 (injury Oct 2024)  REFERRING DIAG: W09.81XB (ICD-10-CM) - Unspecified injury of left wrist, hand and finger(s), initial encounter  THERAPY DIAG:  Thumb pain, left  Muscle weakness (generalized)  Rationale for Evaluation and Treatment:  Rehabilitation  SUBJECTIVE:   SUBJECTIVE STATEMENT: Pt reports injury to L thumb back in Oct 2024 while playing with a small child.  Pt reports pain in space between [facility] and MP joint.  Pt reports x-ray showed some arthritis but he questions whether the imaging went low enough and he feels his pain in lower than the imaging showed.  Pt reports pain when lifting and decreased strength.    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   Pt accompanied by: self  PERTINENT HISTORY: h/o blood clots about 1.5 years ago, ~10 years ago ruptured disk; Recurrent sinus infection/allergic rhinitis, Intermittent low back pain, Anxiety, obstructive sleep apnea   PRECAUTIONS: None  RED FLAGS: None   WEIGHT BEARING RESTRICTIONS: No  PAIN:  Are you having pain? No  FALLS: Has patient fallen in last 6 months? Yes. 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  HAND DOMINANCE: Right  ADLs: WFL  FUNCTIONAL OUTCOME MEASURES: Quick Dash: 15.9   UPPER EXTREMITY ROM:   pain with abduction and thumb opposition to 4th and 5th digit   Active ROM Right eval Left eval  Thumb MCP (0-60)    Thumb IP (0-80) WFL WFL  Thumb Radial abd/add (0-55) WFL  WFL (mild pain)  Thumb Palmar abd/add (0-45) WFL   WFL (mild pain)  Thumb Opposition to Small Finger WFL Mod pain   Index MCP (0-90)     Index PIP (0-100)     Index DIP (0-70)      Long MCP (0-90)      Long PIP (0-100)      Long DIP (0-70)      Ring MCP (0-90)      Ring PIP (0-100)      Ring DIP (0-70)      Little MCP (0-90)      Little PIP (0-100)      Little DIP (0-70)      (Blank rows = not tested)    HAND FUNCTION: Grip strength: Right: 88 lbs; Left: 85 lbs, Lateral pinch: Right: 22 lbs, Left: 18 lbs, and 3 point pinch: Right: 20 lbs, Left: 6 lbs. OF NOTE: Mild pain with L grip, with tendency to attempt grasp without use of thumb yielding 60-65# grip strength.  Painful with 3 point pinch.  COORDINATION: 9 Hole Peg test: Right: 23.09 sec; Left: 23.25 sec - reports mild pain with pinching motion  SENSATION: WFL  COGNITION: Overall cognitive status: Within functional limits for tasks assessed  OBSERVATIONS: Pt with tendency to maintain extension of L thumb with attempts at grip strength, but when incorporating it pt with significant increase in grip strength but also with increase in pain.     TREATMENT DATE:  12/04/23 Educated on role and purpose of OT as well as goals for therapy based on initial evaluation findings. OT instructed pt in thumb opposition to each finger and then sliding down small finger to increased stretch/ROM. Pt with reports of increased min pain when opposing to ring and small finger.  OT instructed in [facility] stabilization and thumb stretch. Modalities: OT briefly educating on benefits of massage and heat prior to exercises with plan to further address during initial treatment session.                                                                                                                        PATIENT EDUCATION: Education details: Educated on role and purpose of OT as well as potential interventions and goals for therapy based on initial evaluation findings. Person educated: Patient Education  method: Explanation, Verbal cues, and Handouts Education comprehension: verbalized understanding and needs further education  HOME  EXERCISE PROGRAM: Access Code: [email or web address] URL: [email or web address] Date: 12/04/2023 Prepared by: [facility] - Outpatient  Rehab - [facility]  Exercises - Thumb AROM Opposition To All Fingers  - 3-4 x daily - 10 reps - Seated Thumb Composite Flexion AROM  - 3-4 x daily - 10 reps - Thumb Webspace Stretch  - 3-4 x daily - 5 reps - 10-15 sec hold - Thumb Strengthening Stabilization CMC  - 3-4 x daily - 5 reps - 5-10 sec hold  GOALS: Goals reviewed with patient? Yes  SHORT TERM GOALS: Target date: 01/02/24  Pt will demonstrate and/or report understanding of initial HEP to improve pain levels and prerequisite motion of left thumb. Baseline: new to OPOT Goal status: INITIAL  2.  Pt will report understanding of joint protection strategies and adaptive strategies to continue to complete work related tasks while not increasing pain > 2/10 on pain scale. Baseline: pain with grasping 50# feed bag Goal status: INITIAL   LONG TERM GOALS: Target date: 01/30/24  Pt will improve functional ability by decreased impairment per Quick DASH assessment for better quality of life. Baseline: 15.9% Goal status: INITIAL  2.  Pt will verbalize decrease in pain and difficulty from mod to min when completing heavy household chores per QuickDASH Baseline: mod on pain and mod on heavy household chores on QuickDASH Goal status: INITIAL  3.  Pt will improve grip strength in left hand by average of 10 lbs out of 3 attempts for functional use at home and in IADLs. Baseline: 85# x1, however 60-65# x3 Goal status: INITIAL  4.  Pt will report improved ability to break open cotton pucks (requiring thumb flexion and adduction against resistance). Baseline: painful Goal status: INITIAL   ASSESSMENT:  CLINICAL IMPRESSION: Patient is a 58 y.o. male who was seen  today for occupational therapy evaluation for L thumb pain and weakness s/p injury in Oct 2024 with persistent pain particularly in IP and CMC joint. X-ray reveals joint space narrowing and osteophytes consistent with osteoarthritis in the thumb joint, no evidence of fracture of dislocation. Ultrasound examination shows intact ligaments and tendons.  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 Pt will benefit from skilled occupational therapy services to address strength and coordination, pain management, GM/FM control, safety awareness, introduction of compensatory strategies/AE prn, and implementation of an HEP to improve participation and safety during ADLs and work related tasks.    PERFORMANCE DEFICITS: in functional skills including ADLs, IADLs, ROM, strength, pain, Gross motor control, decreased knowledge of precautions, and UE functional use and psychosocial skills including environmental adaptation and routines and behaviors.   IMPAIRMENTS: are limiting patient from IADLs and work.   COMORBIDITIES: may have co-morbidities  that affects occupational performance. Patient will benefit from skilled OT to address above impairments and improve overall function.  MODIFICATION OR ASSISTANCE TO COMPLETE EVALUATION: No modification of tasks or assist necessary to complete an evaluation.  OT OCCUPATIONAL PROFILE AND HISTORY: Problem focused assessment: Including review of records relating to presenting problem.  CLINICAL DECISION MAKING: LOW - limited treatment options, no task modification necessary  REHAB POTENTIAL: Good  EVALUATION COMPLEXITY: Low      PLAN:  OT FREQUENCY: 2x/week  OT DURATION: 6 weeks  PLANNED INTERVENTIONS: 97168 OT Re-evaluation, 97535 self care/ADL training, 64332 therapeutic exercise, 97530 therapeutic activity, 97112 neuromuscular re-education, 97140  manual therapy, 97035 ultrasound,  97018 paraffin, 47425 fluidotherapy, 97010 moist heat, 97010 cryotherapy, 97760 Orthotics management and training, 95638 Splinting (initial encounter), [phone] Subsequent splinting/medication, passive range of motion, compression bandaging, energy conservation, coping strategies training, patient/family education, and DME and/or AE instructions  RECOMMENDED OTHER SERVICES: NA  CONSULTED AND AGREED WITH PLAN OF CARE: Patient  PLAN FOR NEXT SESSION: review HEP, initiate heat and massage and educate on carryover for home.  Joint protection strategies.  Future sessions may benefit from ultrasound.  For all possible CPT codes, reference the Planned Interventions line above.     Check all conditions that are expected to impact treatment: {Conditions expected to impact treatment:None of these apply   If treatment provided at initial evaluation, no treatment charged due to lack of authorization.      Rosalio Loud, OTR/L 12/04/2023, 4:14 PM   [facility] Outpatient Rehab at [facility] [address] [place], [address] [place], [place] [address] Phone # [phone] Fax # [phone]

## 2023-12-10 ENCOUNTER — Ambulatory Visit: Admitting: Occupational Therapy

## 2023-12-10 DIAGNOSIS — M6281 Muscle weakness (generalized): Secondary | ICD-10-CM | POA: Diagnosis not present

## 2023-12-10 DIAGNOSIS — M79645 Pain in left finger(s): Secondary | ICD-10-CM | POA: Diagnosis not present

## 2023-12-10 NOTE — Therapy (Signed)
 OUTPATIENT OCCUPATIONAL THERAPY ORTHO Treatment Note  Patient Name: Steven Kirby MRN: [id number] DOB:10/27/1965, 58 y.o., male Today's Date: 12/10/2023  PCP: Elias Else, MD REFERRING PROVIDER: Hurman Horn, MD  END OF SESSION:  OT End of Session - 12/10/23 1323     Visit Number 2    Number of Visits 13    Date for OT Re-Evaluation 01/30/24   asking for 8 weeks to accommodate scheduling   Authorization Type BCBS [id number]    Authorization Time Period Auth#: [id number] , approved 5 OT visits from 12/04/2023-02/01/2024 (30 visits combined OT/PT/ST)    OT Start Time 1150    OT Stop Time 1232    OT Time Calculation (min) 42 min              Past Medical History:  Diagnosis Date   Allergic rhinitis    Anxiety    Anxiety    Chronic low back pain    Chronic sinusitis    Hemorrhage of rectum and anus    Hemorrhoids    History of MRSA infection    OSA (obstructive sleep apnea)    Past Surgical History:  Procedure Laterality Date   COLONOSCOPY     LUMBAR LAMINECTOMY/DECOMPRESSION MICRODISCECTOMY Right 03/17/2015   Procedure: Right Lumbar Four- Five Far Lateral Diskectomy;  Surgeon: Coletta Memos, MD;  Location: [facility];  Service: Neurosurgery;  Laterality: Right;  Right L45 far lateral diskectomy   MOLE REMOVAL     RIGHT HEART CATH N/A 05/02/2020   Procedure: RIGHT HEART CATH;  Surgeon: Swazil[place], Peter M, MD;  Location: [facility] INVASIVE CV LAB;  Service: Cardiovascular;  Laterality: N/A;   VASECTOMY     Patient Active Problem List   Diagnosis Date Noted   Dyspnea on exertion 05/02/2020   Pre-syncope 11/04/2018   HNP (herniated nucleus pulposus), lumbar 03/17/2015   Obstructive sleep apnea on CPAP 06/07/2013   Obesity (BMI 30-39.9) 06/07/2013    ONSET DATE: referral 11/25/23 (injury Oct 2024)  REFERRING DIAG: L24.40NU (ICD-10-CM) - Unspecified injury of left wrist, hand and finger(s), initial encounter  THERAPY DIAG:  Thumb pain, left  Muscle weakness  (generalized)  Rationale for Evaluation and Treatment: Rehabilitation  SUBJECTIVE:   SUBJECTIVE STATEMENT: Pt reports slight pain in L thumb with thumb composite flexion and CMC stabilization.     Pt accompanied by: self  PERTINENT HISTORY: h/o blood clots about 1.5 years ago, ~10 years ago ruptured disk; Recurrent sinus infection/allergic rhinitis, Intermittent low back pain, Anxiety, obstructive sleep apnea   PRECAUTIONS: None  RED FLAGS: None   WEIGHT BEARING RESTRICTIONS: No  PAIN:  Are you having pain? No  FALLS: Has patient fallen in last 6 months? Yes. 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  HAND DOMINANCE: Right  ADLs: WFL  FUNCTIONAL OUTCOME MEASURES: Quick Dash: 15.9   UPPER EXTREMITY ROM:   pain with  abduction and thumb opposition to 4th and 5th digit   Active ROM Right eval Left eval  Thumb MCP (0-60)    Thumb IP (0-80) WFL WFL  Thumb Radial abd/add (0-55) WFL  WFL (mild pain)  Thumb Palmar abd/add (0-45) WFL  WFL (mild pain)  Thumb Opposition to Small Finger WFL Mod pain   Index MCP (0-90)     Index PIP (0-100)     Index DIP (0-70)      Long MCP (0-90)      Long PIP (0-100)      Long DIP (0-70)      Ring MCP (0-90)      Ring PIP (0-100)      Ring DIP (0-70)      Little MCP (0-90)      Little PIP  (0-100)      Little DIP (0-70)      (Blank rows = not tested)    HAND FUNCTION: Grip strength: Right: 88 lbs; Left: 85 lbs, Lateral pinch: Right: 22 lbs, Left: 18 lbs, and 3 point pinch: Right: 20 lbs, Left: 6 lbs. OF NOTE: Mild pain with L grip, with tendency to attempt grasp without use of thumb yielding 60-65# grip strength.  Painful with 3 point pinch.  COORDINATION: 9 Hole Peg test: Right: 23.09 sec; Left: 23.25 sec - reports mild pain with pinching motion  SENSATION: WFL  COGNITION: Overall cognitive status: Within functional limits for tasks assessed  OBSERVATIONS: Pt with tendency to maintain extension of L thumb with attempts at grip strength, but when incorporating it pt with significant increase in grip strength but also with increase in pain.     TREATMENT DATE:  12/10/23 Self-care: educated on benefits of massage and heat prior to exercises to facilitate improved mobility and reduce pain.  OT providing with handouts about heat use prior to exercises and types of moist heat.  OT providing written instructions for massage technique and frequency.  Massage: engaged in massage at thumb from just distal to IP joint and proximal to [facility] joint and into thenar eminence.  OT providing massage from distal thumb to proximal/into hand in clockwise, counter-clockwise, parallel, and perpendicular to the area of tenderness.  OT utilizing lotion to provide increased manipulation. Joint protection strategies: educated pt on use of alternative hand placements to decrease pressure on and/or through thumb, use of bilateral UE when opening containers, and use of scissors when opening bags to decrease pressure on thumb.  Provided with handouts. Thumb ROM: Reviewed exercises from previous session and adding PROM composite thumb flexion and thumb circumduction -in both directions.  Pt completing review of each and new exercises x5 each. - Thumb AROM Opposition To All Fingers   - Seated Thumb Composite  Flexion AROM  - Thumb Webspace Stretch   - Thumb Strengthening Stabilization CMC  - Seated Composite Thumb Flexion PROM  - Seated Thumb Circumduction AROM     12/04/23 Educated on role and purpose of OT as well as goals for therapy based on initial evaluation findings. OT instructed pt in thumb opposition to each finger and then sliding down small finger to increased stretch/ROM. Pt with reports of increased min pain when opposing to ring and small finger.  OT instructed in [facility] stabilization and thumb stretch. Modalities: OT briefly educating on benefits of massage and heat prior to exercises with plan to further address during initial treatment session.  PATIENT EDUCATION: Education details: edu on heat modalities, massage, and joint protection strategies Person educated: Patient Education method: Explanation, Verbal cues, and Handouts Education comprehension: verbalized understanding and needs further education  HOME EXERCISE PROGRAM: Access Code: [email or web address] URL: [email or web address] Date: 12/10/2023 (updated) Prepared by: [facility] - Outpatient  Rehab - [facility]  Exercises - Thumb AROM Opposition To All Fingers  - 3-4 x daily - 10 reps - Seated Thumb Composite Flexion AROM  - 3-4 x daily - 10 reps - Thumb Webspace Stretch  - 3-4 x daily - 5 reps - 10-15 sec hold - Thumb Strengthening Stabilization CMC  - 3-4 x daily - 5 reps - 5-10 sec hold - Seated Composite Thumb Flexion PROM  - 3-4 x daily - 5 reps - 5-10 hold - Seated Thumb Circumduction AROM  - 3-4 x daily - 5 reps  GOALS: Goals reviewed with patient? Yes  SHORT TERM GOALS: Target date: 01/02/24  Pt will demonstrate and/or report understanding of initial HEP to improve pain levels and prerequisite motion of left thumb. Baseline: new to OPOT Goal status: INITIAL  2.  Pt will  report understanding of joint protection strategies and adaptive strategies to continue to complete work related tasks while not increasing pain > 2/10 on pain scale. Baseline: pain with grasping 50# feed bag Goal status: INITIAL   LONG TERM GOALS: Target date: 01/30/24  Pt will improve functional ability by decreased impairment per Quick DASH assessment for better quality of life. Baseline: 15.9% Goal status: INITIAL  2.  Pt will verbalize decrease in pain and difficulty from mod to min when completing heavy household chores per QuickDASH Baseline: mod on pain and mod on heavy household chores on QuickDASH Goal status: INITIAL  3.  Pt will improve grip strength in left hand by average of 10 lbs out of 3 attempts for functional use at home and in IADLs. Baseline: 85# x1, however 60-65# x3 Goal status: INITIAL  4.  Pt will report improved ability to break open cotton pucks (requiring thumb flexion and adduction against resistance). Baseline: painful Goal status: INITIAL   ASSESSMENT:  CLINICAL IMPRESSION: Patient tolerated all exercises with reports of min pain in thumb with opposition to small finger and with CMC stabilization.  Pt reporting understanding of education on use of massage, heat, and joint protection strategies.  Pt will continue to benefit from skilled occupational therapy services to address strength and coordination, pain management, GM/FM control, safety awareness, introduction of compensatory strategies/AE prn, and implementation of an HEP to improve participation and safety during ADLs and work related tasks.    PERFORMANCE DEFICITS: in functional skills including ADLs, IADLs, ROM, strength, pain, Gross motor control, decreased knowledge of precautions, and UE functional use and psychosocial skills including environmental adaptation and routines and behaviors.   IMPAIRMENTS: are limiting patient from IADLs and work.   COMORBIDITIES: may have co-morbidities  that  affects occupational performance. Patient will benefit from skilled OT to address above impairments and improve overall function.     PLAN:  OT FREQUENCY: 2x/week  OT DURATION: 6 weeks  PLANNED INTERVENTIONS: 97168 OT Re-evaluation, 97535 self care/ADL training, 47829 therapeutic exercise, 97530 therapeutic activity, 97112 neuromuscular re-education, 97140 manual therapy, 97035 ultrasound, 97018 paraffin, 56213 fluidotherapy, 97760 Orthotics management and training, 08657 Splinting (initial encounter), [id number] Subsequent splinting/medication, passive range of motion, compression bandaging, energy conservation, coping strategies training, patient/family education, and DME and/or AE instructions  RECOMMENDED OTHER SERVICES: NA  CONSULTED AND AGREED WITH PLAN OF CARE:  Patient  PLAN FOR NEXT SESSION: review HEP, review massage.  Joint protection strategies.  Future sessions may benefit from ultrasound.      Rosalio Loud, OTR/L 12/10/2023, 4:02 PM   [facility] Health Outpatient Rehab at [facility] [address] [place], [address] [place], [place] [address] Phone # [phone] Fax # [phone]

## 2023-12-10 NOTE — Patient Instructions (Addendum)
   MASSAGE: - complete in clockwise, counterclockwise, parallel, and perpendicular to area of pain/tenderness - utilize lotion as needed to reduce friction - complete for 3-5 mins 4x/day for pain reduction and increased ROM

## 2023-12-12 ENCOUNTER — Ambulatory Visit: Admitting: Occupational Therapy

## 2023-12-12 DIAGNOSIS — M6281 Muscle weakness (generalized): Secondary | ICD-10-CM

## 2023-12-12 DIAGNOSIS — M79645 Pain in left finger(s): Secondary | ICD-10-CM

## 2023-12-12 NOTE — Therapy (Signed)
 OUTPATIENT OCCUPATIONAL THERAPY ORTHO Treatment Note  Patient Name: Steven Kirby MRN: [id number] DOB:09-18-1965, 58 y.o., male Today's Date: 12/12/2023  PCP: Elias Else, MD REFERRING PROVIDER: Hurman Horn, MD  END OF SESSION:  OT End of Session - 12/12/23 1119     Visit Number 3    Number of Visits 13    Date for OT Re-Evaluation 01/30/24   asking for 8 weeks to accommodate scheduling   Authorization Type [employer] [id number]    Authorization Time Period Auth#: [id number] , approved 5 OT visits from 12/04/2023-02/01/2024 (30 visits combined OT/PT/ST)    Authorization - Visit Number 3    Authorization - Number of Visits 5    OT Start Time 1106    OT Stop Time 1148    OT Time Calculation (min) 42 min               Past Medical History:  Diagnosis Date   Allergic rhinitis    Anxiety    Anxiety    Chronic low back pain    Chronic sinusitis    Hemorrhage of rectum and anus    Hemorrhoids    History of MRSA infection    OSA (obstructive sleep apnea)    Past Surgical History:  Procedure Laterality Date   COLONOSCOPY     LUMBAR LAMINECTOMY/DECOMPRESSION MICRODISCECTOMY Right 03/17/2015   Procedure: Right Lumbar Four- Five Far Lateral Diskectomy;  Surgeon: Coletta Memos, MD;  Location: [facility];  Service: Neurosurgery;  Laterality: Right;  Right L45 far lateral diskectomy   MOLE REMOVAL     RIGHT HEART CATH N/A 05/02/2020   Procedure: RIGHT HEART CATH;  Surgeon: [place], Peter M, MD;  Location: [facility] INVASIVE CV LAB;  Service: Cardiovascular;  Laterality: N/A;   VASECTOMY     Patient Active Problem List   Diagnosis Date Noted   Dyspnea on exertion 05/02/2020   Pre-syncope 11/04/2018   HNP (herniated nucleus pulposus), lumbar 03/17/2015   Obstructive sleep apnea on CPAP 06/07/2013   Obesity (BMI 30-39.9) 06/07/2013    ONSET DATE: referral 11/25/23 (injury Oct 2024)  REFERRING DIAG: N82.95AO (ICD-10-CM) - Unspecified injury of left wrist, hand and finger(s), initial  encounter  THERAPY DIAG:  Thumb pain, left  Muscle weakness (generalized)  Rationale for Evaluation and Treatment: Rehabilitation  SUBJECTIVE:   SUBJECTIVE STATEMENT: Pt reports overall feeling some improvements, noticing improvements with picking up feed bag.   Pt accompanied by: self  PERTINENT HISTORY: h/o blood clots about 1.5 years ago, ~10 years ago ruptured disk; Recurrent sinus infection/allergic rhinitis, Intermittent low back pain, Anxiety, obstructive sleep apnea   PRECAUTIONS: None  RED FLAGS: None   WEIGHT BEARING RESTRICTIONS: No  PAIN:  Are you having pain? Yes: NPRS scale: 4-5/10 Pain location: proximal thumb Pain description: shooting Aggravating factors: thumb opposition to pinky Relieving factors: not stated  FALLS: Has patient fallen in last 6 months? Yes. 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  HAND DOMINANCE: Right  ADLs: WFL  FUNCTIONAL OUTCOME MEASURES: Quick Dash: 15.9   UPPER EXTREMITY ROM:   pain with abduction and thumb opposition to 4th and 5th digit   Active ROM Right eval Left eval  Thumb MCP (0-60)    Thumb IP (0-80) WFL WFL  Thumb Radial abd/add (0-55) WFL  WFL (mild pain)  Thumb Palmar abd/add (0-45) WFL  WFL (mild pain)  Thumb Opposition to Small Finger WFL Mod pain   Index  MCP (0-90)     Index PIP (0-100)     Index DIP (0-70)      Long MCP (0-90)      Long PIP (0-100)      Long DIP (0-70)      Ring MCP (0-90)      Ring PIP (0-100)      Ring DIP (0-70)      Little MCP (0-90)      Little PIP (0-100)      Little DIP (0-70)      (Blank rows = not tested)    HAND FUNCTION: Grip strength: Right: 88 lbs; Left: 85 lbs, Lateral pinch: Right: 22 lbs, Left: 18 lbs, and 3 point pinch: Right: 20 lbs, Left: 6 lbs. OF NOTE: Mild pain with L grip, with tendency to attempt grasp without use of thumb yielding 60-65# grip strength.  Painful with 3 point pinch.  COORDINATION: 9 Hole Peg test: Right: 23.09 sec; Left: 23.25 sec - reports mild pain with pinching motion  SENSATION: WFL  COGNITION: Overall cognitive status: Within functional limits for tasks assessed  OBSERVATIONS: Pt with tendency to maintain extension of L thumb with attempts at grip strength, but when incorporating it pt with significant increase in grip strength but also with increase in pain.     TREATMENT DATE:  12/12/23 Self-care: reiterated education on use of moist heat and massage for pain relief and use of heat prior to exercises to increase ROM and decrease pain.   Resistance Clothespins 2,4,6# with LUE for mid functional reaching and sustained pinch.  Completed 3 jaw chuck and lateral pinch with reports of mild pain with increased resistance and with 3 jaw chuck more than lateral pinch.   Putty: engaged in full grasp, 3 jaw chuck, lateral pinch, and thumb MCP flexion with yellow theraputty.  Pt completing x10+ with each exercise reporting mild increased pain with 3 jaw chuck with larger clump of putty.  OT educating on completion of various grips and pinches to carry over to other functional movements.   Massage: engaged in massage at thenar eminence as pt with reports of increased pain in this area against resistance.  OT providing massage f in clockwise, counter-clockwise, parallel, and  perpendicular to the area of tenderness.  OT utilizing lotion to provide increased manipulation.    12/10/23 Self-care: educated on benefits of massage and heat prior to exercises to facilitate improved mobility and reduce pain.  OT providing with handouts about heat use prior to exercises and types of moist heat.  OT providing written instructions for massage technique and frequency.  Massage: engaged in massage at thumb from just distal to IP joint and proximal to [facility] joint and into thenar eminence.  OT providing massage from distal thumb to proximal/into hand in clockwise, counter-clockwise, parallel, and perpendicular to the area of tenderness.  OT utilizing lotion to provide increased manipulation. Joint protection strategies: educated pt on use of  alternative hand placements to decrease pressure on and/or through thumb, use of bilateral UE when opening containers, and use of scissors when opening bags to decrease pressure on thumb.  Provided with handouts. Thumb ROM: Reviewed exercises from previous session and adding PROM composite thumb flexion and thumb circumduction -in both directions.  Pt completing review of each and new exercises x5 each. - Thumb AROM Opposition To All Fingers   - Seated Thumb Composite Flexion AROM  - Thumb Webspace Stretch   - Thumb Strengthening Stabilization CMC  - Seated Composite Thumb Flexion PROM  - Seated Thumb Circumduction AROM     12/04/23 Educated on role and purpose of OT as well as goals for therapy based on initial evaluation findings. OT instructed pt in thumb opposition to each finger and then sliding down small finger to increased stretch/ROM. Pt with reports of increased min pain when opposing to ring and small finger.  OT instructed in [facility] stabilization and thumb stretch. Modalities: OT briefly educating on benefits of massage and heat prior to exercises with plan to further address during initial treatment session.                                                                                                                         PATIENT EDUCATION: Education details: edu on heat modalities, massage, and joint protection strategies Person educated: Patient Education method: Explanation, Verbal cues, and Handouts Education comprehension: verbalized understanding and needs further education  HOME EXERCISE PROGRAM: Access Code: [email or web address] URL: [email or web address] Date: 12/10/2023 (updated) Prepared by: [facility] - Outpatient  Rehab - [facility]  Exercises - Thumb AROM Opposition To All Fingers  - 3-4 x daily - 10 reps - Seated Thumb Composite Flexion AROM  - 3-4 x daily - 10 reps - Thumb Webspace Stretch  - 3-4 x daily - 5 reps - 10-15 sec hold - Thumb Strengthening Stabilization CMC  - 3-4 x daily - 5 reps - 5-10 sec hold - Seated Composite Thumb Flexion PROM  - 3-4 x daily - 5 reps - 5-10 hold - Seated Thumb Circumduction AROM  - 3-4 x daily - 5 reps  GOALS: Goals reviewed with patient? Yes  SHORT TERM GOALS: Target date: 01/02/24  Pt will demonstrate and/or report understanding of initial HEP to improve pain levels and prerequisite motion of left thumb. Baseline: new to OPOT Goal status: IN PROGRESS  2.  Pt will report understanding of joint protection strategies and adaptive strategies to continue to complete work related tasks while not increasing pain > 2/10 on pain scale. Baseline: pain with grasping 50# feed bag Goal status:  IN PROGRESS   LONG TERM GOALS: Target date: 01/30/24  Pt will improve functional ability by decreased impairment per Quick DASH assessment for better quality of life. Baseline: 15.9% Goal status:  IN PROGRESS  2.  Pt will verbalize decrease in pain and difficulty from mod to min when completing heavy household chores per QuickDASH Baseline: mod on pain  and mod on heavy household chores on QuickDASH Goal status:  IN PROGRESS  3.  Pt will improve grip strength in  left hand by average of 10 lbs out of 3 attempts for functional use at home and in IADLs. Baseline: 85# x1, however 60-65# x3 Goal status:  IN PROGRESS  4.  Pt will report improved ability to break open cotton pucks (requiring thumb flexion and adduction against resistance). Baseline: painful Goal status:  IN PROGRESS   ASSESSMENT:  CLINICAL IMPRESSION: Patient tolerated all exercises with reports of min increased pain in thumb and thenar eminence after resistive exercises.  Pt reporting understanding of rationale of exercises and addition of putty for strengthening.  Pt will continue to benefit from skilled occupational therapy services to address strength and coordination, pain management, GM/FM control, safety awareness, introduction of compensatory strategies/AE prn, and implementation of an HEP to improve participation and safety during ADLs and work related tasks.    PERFORMANCE DEFICITS: in functional skills including ADLs, IADLs, ROM, strength, pain, Gross motor control, decreased knowledge of precautions, and UE functional use and psychosocial skills including environmental adaptation and routines and behaviors.   IMPAIRMENTS: are limiting patient from IADLs and work.   COMORBIDITIES: may have co-morbidities  that affects occupational performance. Patient will benefit from skilled OT to address above impairments and improve overall function.     PLAN:  OT FREQUENCY: 2x/week  OT DURATION: 6 weeks  PLANNED INTERVENTIONS: 97168 OT Re-evaluation, 97535 self care/ADL training, 16109 therapeutic exercise, 97530 therapeutic activity, 97112 neuromuscular re-education, 97140 manual therapy, 97035 ultrasound, 97018 paraffin, 60454 fluidotherapy, 97760 Orthotics management and training, 09811 Splinting (initial encounter), [id number] Subsequent splinting/medication, passive range of motion, compression bandaging, energy conservation, coping strategies training, patient/family education, and  DME and/or AE instructions  RECOMMENDED OTHER SERVICES: NA  CONSULTED AND AGREED WITH PLAN OF CARE: Patient  PLAN FOR NEXT SESSION: review HEP, review massage.  Joint protection strategies.    Future sessions may benefit from ultrasound.     Rosalio Loud, OTR/L 12/12/2023, 11:19 AM   [facility] at [facility] [address] [place], [address] [place], [place] [address] Phone # [phone] Fax # [phone]

## 2023-12-15 ENCOUNTER — Ambulatory Visit: Admitting: Occupational Therapy

## 2023-12-22 ENCOUNTER — Ambulatory Visit: Admitting: Occupational Therapy

## 2023-12-22 DIAGNOSIS — M6281 Muscle weakness (generalized): Secondary | ICD-10-CM

## 2023-12-22 DIAGNOSIS — M79645 Pain in left finger(s): Secondary | ICD-10-CM | POA: Diagnosis not present

## 2023-12-22 NOTE — Therapy (Signed)
 OUTPATIENT OCCUPATIONAL THERAPY ORTHO Treatment Note  Patient Name: Steven Kirby MRN: [id number] DOB:05-Nov-1965, 58 y.o., male Today's Date: 12/22/2023  PCP: Vidal Graven, MD REFERRING PROVIDER: Rance Burrows, MD  END OF SESSION:  OT End of Session - 12/22/23 1239     Visit Number 4    Number of Visits 13    Date for OT Re-Evaluation 01/30/24   asking for 8 weeks to accommodate scheduling   Authorization Type [employer] [id number]    Authorization Time Period Auth#: [id number] , approved 5 OT visits from 12/04/2023-02/01/2024 (30 visits combined OT/PT/ST)    Authorization - Visit Number 4    Authorization - Number of Visits 5    OT Start Time 1149    OT Stop Time 1227    OT Time Calculation (min) 38 min                Past Medical History:  Diagnosis Date   Allergic rhinitis    Anxiety    Anxiety    Chronic low back pain    Chronic sinusitis    Hemorrhage of rectum and anus    Hemorrhoids    History of MRSA infection    OSA (obstructive sleep apnea)    Past Surgical History:  Procedure Laterality Date   COLONOSCOPY     LUMBAR LAMINECTOMY/DECOMPRESSION MICRODISCECTOMY Right 03/17/2015   Procedure: Right Lumbar Four- Five Far Lateral Diskectomy;  Surgeon: Audie Bleacher, MD;  Location: [facility];  Service: Neurosurgery;  Laterality: Right;  Right L45 far lateral diskectomy   MOLE REMOVAL     RIGHT HEART CATH N/A 05/02/2020   Procedure: RIGHT HEART CATH;  Surgeon: [place], Peter M, MD;  Location: [facility] INVASIVE CV LAB;  Service: Cardiovascular;  Laterality: N/A;   VASECTOMY     Patient Active Problem List   Diagnosis Date Noted   Dyspnea on exertion 05/02/2020   Pre-syncope 11/04/2018   HNP (herniated nucleus pulposus), lumbar 03/17/2015   Obstructive sleep apnea on CPAP 06/07/2013   Obesity (BMI 30-39.9) 06/07/2013    ONSET DATE: referral 11/25/23 (injury Oct 2024)  REFERRING DIAG: N82.95AO (ICD-10-CM) - Unspecified injury of left wrist, hand and finger(s), initial  encounter  THERAPY DIAG:  Thumb pain, left  Muscle weakness (generalized)  Rationale for Evaluation and Treatment: Rehabilitation  SUBJECTIVE:   SUBJECTIVE STATEMENT: Pt reports thinking about the exercises when he is driving, therefore has not done much with the putty.  Pt reports increased pain with thumb circumduction Pt accompanied by: self  PERTINENT HISTORY: h/o blood clots about 1.5 years ago, ~10 years ago ruptured disk; Recurrent sinus infection/allergic rhinitis, Intermittent low back pain, Anxiety, obstructive sleep apnea   PRECAUTIONS: None  RED FLAGS: None   WEIGHT BEARING RESTRICTIONS: No  PAIN:  Are you having pain? Yes: NPRS scale: 2/10 Pain location: proximal thumb Pain description: shooting Aggravating factors: thumb circumduction Relieving factors: not stated  FALLS: Has patient fallen in last 6 months? Yes. 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  HAND DOMINANCE: Right  ADLs: WFL  FUNCTIONAL OUTCOME MEASURES: Quick Dash: 15.9   UPPER EXTREMITY ROM:   pain with abduction and thumb opposition to 4th and 5th digit   Active ROM Right eval Left eval  Thumb MCP (0-60)    Thumb IP (0-80) WFL WFL  Thumb Radial abd/add (0-55) WFL  WFL (mild pain)  Thumb Palmar abd/add (0-45) WFL  WFL (mild pain)   Thumb Opposition to Small Finger WFL Mod pain   Index MCP (0-90)     Index PIP (0-100)     Index DIP (0-70)      Long MCP (0-90)      Long PIP (0-100)      Long DIP (0-70)      Ring MCP (0-90)      Ring PIP (0-100)      Ring DIP (0-70)      Little MCP (0-90)      Little PIP (0-100)      Little DIP (0-70)      (Blank rows = not tested)    HAND FUNCTION: Grip strength: Right: 88 lbs; Left: 85 lbs, Lateral pinch: Right: 22 lbs, Left: 18 lbs, and 3 point pinch: Right: 20 lbs, Left: 6 lbs. OF NOTE: Mild pain with L grip, with tendency to attempt grasp without use of thumb yielding 60-65# grip strength.  Painful with 3 point pinch.  COORDINATION: 9 Hole Peg test: Right: 23.09 sec; Left: 23.25 sec - reports mild pain with pinching motion  SENSATION: WFL  COGNITION: Overall cognitive status: Within functional limits for tasks assessed  OBSERVATIONS: Pt with tendency to maintain extension of L thumb with attempts at grip strength, but when incorporating it pt with significant increase in grip strength but also with increase in pain.     TREATMENT DATE:  12/22/23 Self-care: pt reports improved ease with picking up feed bags.  Engaged in discussion about typical hand placement when picking feed bags; pt reporting 3 jaw chuck or lateral pinch to hold on to tab at bottom of bag in L hand.  OT reiterated use of 3 jaw chuck and lateral pinch with putty and/or use of chip clips in home for pinch strengthening while attending to onset of pain. ROM: OT reviewed HEP with pt, removing circumduction at this time due to onset of pain last week.  Reiterated sustained hold with AROM and PROM as well as putty exercises to carry over to work related tasks on the cattle farm. Hand gripper: with LUE on level 55# progressing to 70# resistance.  Pt picked up 1 inch blocks with gripper with 0 drops, however reports increase in pain in thumb with increased resistance and after increased repetition with  challenge of increased sustained grasp.   Ultrasound Ultrasound treatment applied to L thumb and into thenar eminence for 8 min to address pain relief and promote blood flow for tissue healing; pt verbally denied contraindications or precautions. No adverse reaction observed or reported after application; skin intact. Parameters: 3.3 MHz, 20% duty cycle, and intensity of 0.8 W/cm2     12/12/23 Self-care: reiterated education on use of moist heat and massage for pain relief and use of heat prior to exercises to increase ROM and decrease pain.   Resistance Clothespins 2,4,6# with LUE for mid functional reaching and sustained pinch.  Completed 3 jaw chuck and lateral pinch  with reports of mild pain with increased resistance and with 3 jaw chuck more than lateral pinch.   Putty: engaged in full grasp, 3 jaw chuck, lateral pinch, and thumb MCP flexion with yellow theraputty.  Pt completing x10+ with each exercise reporting mild increased pain with 3 jaw chuck with larger clump of putty.  OT educating on completion of various grips and pinches to carry over to other functional movements.   Massage: engaged in massage at thenar eminence as pt with reports of increased pain in this area against resistance.  OT providing massage f in clockwise, counter-clockwise, parallel, and perpendicular to the area of tenderness.  OT utilizing lotion to provide increased manipulation.    12/10/23 Self-care: educated on benefits of massage and heat prior to exercises to facilitate improved mobility and reduce pain.  OT providing with handouts about heat use prior to exercises and types of moist heat.  OT providing written instructions for massage technique and frequency.  Massage: engaged in massage at thumb from just distal to IP joint and proximal to [employer] joint and into thenar eminence.  OT providing massage from distal thumb to proximal/into hand in clockwise, counter-clockwise, parallel, and perpendicular to the area of  tenderness.  OT utilizing lotion to provide increased manipulation. Joint protection strategies: educated pt on use of alternative hand placements to decrease pressure on and/or through thumb, use of bilateral UE when opening containers, and use of scissors when opening bags to decrease pressure on thumb.  Provided with handouts. Thumb ROM: Reviewed exercises from previous session and adding PROM composite thumb flexion and thumb circumduction -in both directions.  Pt completing review of each and new exercises x5 each. - Thumb AROM Opposition To All Fingers   - Seated Thumb Composite Flexion AROM  - Thumb Webspace Stretch   - Thumb Strengthening Stabilization CMC  - Seated Composite Thumb Flexion PROM  - Seated Thumb Circumduction AROM      PATIENT EDUCATION: Education details: edu on heat modalities, massage, and joint protection strategies Person educated: Patient Education method: Explanation, Verbal cues, and Handouts Education comprehension: verbalized understanding and needs further education  HOME EXERCISE PROGRAM: Access Code: [email or web address] URL: [email or web address] Date: 12/10/2023 (updated) Prepared by: [facility] - Outpatient  Rehab - [facility]  Exercises - Thumb AROM Opposition To All Fingers  - 3-4 x daily - 10 reps - Seated Thumb Composite Flexion AROM  - 3-4 x daily - 10 reps - Thumb Webspace Stretch  - 3-4 x daily - 5 reps - 10-15 sec hold - Thumb Strengthening Stabilization CMC  - 3-4 x daily - 5 reps - 5-10 sec hold - Seated Composite Thumb Flexion PROM  - 3-4 x daily - 5 reps - 5-10 hold - Seated Thumb Circumduction AROM  - 3-4 x daily - 5 reps  GOALS: Goals reviewed with patient? Yes  SHORT TERM GOALS: Target date: 01/02/24  Pt will demonstrate and/or report understanding of initial HEP to improve pain levels and prerequisite motion of left thumb. Baseline: new to OPOT Goal status: IN PROGRESS  2.  Pt will report understanding of joint  protection strategies and adaptive strategies to continue to complete work related tasks while not increasing pain > 2/10 on pain scale. Baseline: pain with grasping 50# feed bag Goal status:  IN PROGRESS   LONG TERM GOALS: Target date: 01/30/24  Pt will improve functional ability by decreased impairment per Quick DASH assessment for better quality of life. Baseline: 15.9% Goal status:  IN PROGRESS  2.  Pt will verbalize decrease in pain and difficulty from mod to min when completing heavy household chores per QuickDASH Baseline: mod on pain and mod on heavy household chores on QuickDASH Goal status:  IN PROGRESS  3.  Pt will improve grip strength in left hand by average of 10 lbs out of 3 attempts for functional use at home and in IADLs. Baseline: 85# x1, however 60-65# x3 Goal status:  IN PROGRESS  4.  Pt will report improved ability to break open cotton pucks (requiring thumb flexion and adduction against resistance). Baseline: painful Goal status:  IN PROGRESS   ASSESSMENT:  CLINICAL IMPRESSION: Patient tolerated all activities this session.  Pt reporting understanding of rationale of exercises and addition of putty for strengthening, reiterating carryover to functional tasks.  Pt tolerating therapeutic ultrasound with no contraindications.  Pt will continue to benefit from skilled occupational therapy services to address strength and coordination, pain management, GM/FM control, safety awareness, introduction of compensatory strategies/AE prn, and implementation of an HEP to improve participation and safety during ADLs and work related tasks.    PERFORMANCE DEFICITS: in functional skills including ADLs, IADLs, ROM, strength, pain, Gross motor control, decreased knowledge of precautions, and UE functional use and psychosocial skills including environmental adaptation and routines and behaviors.   IMPAIRMENTS: are limiting patient from IADLs and work.   COMORBIDITIES: may have  co-morbidities  that affects occupational performance. Patient will benefit from skilled OT to address above impairments and improve overall function.     PLAN:  OT FREQUENCY: 2x/week  OT DURATION: 6 weeks  PLANNED INTERVENTIONS: 97168 OT Re-evaluation, 97535 self care/ADL training, 15176 therapeutic exercise, 97530 therapeutic activity, 97112 neuromuscular re-education, 97140 manual therapy, 97035 ultrasound, 97018 paraffin, 16073 fluidotherapy, 97760 Orthotics management and training, 71062 Splinting (initial encounter), [id number] Subsequent splinting/medication, passive range of motion, compression bandaging, energy conservation, coping strategies training, patient/family education, and DME and/or AE instructions  RECOMMENDED OTHER SERVICES: NA  CONSULTED AND AGREED WITH PLAN OF CARE: Patient  PLAN FOR NEXT SESSION:  REVIEW GOALS and ask for additional visits if needed, insurance only approved 5 visits initially  Review HEP, review massage.  Joint protection strategies.    Future sessions may benefit from ultrasound.     Anthonette Kinsman, OTR/L 12/22/2023, 12:40 PM   [facility] at [facility] [address] [place], [address] [place], [place] [address] Phone # [phone] Fax # [phone]

## 2023-12-24 ENCOUNTER — Ambulatory Visit: Admitting: Occupational Therapy

## 2023-12-26 DIAGNOSIS — R5383 Other fatigue: Secondary | ICD-10-CM | POA: Diagnosis not present

## 2023-12-26 DIAGNOSIS — N400 Enlarged prostate without lower urinary tract symptoms: Secondary | ICD-10-CM | POA: Diagnosis not present

## 2023-12-26 DIAGNOSIS — G4733 Obstructive sleep apnea (adult) (pediatric): Secondary | ICD-10-CM | POA: Diagnosis not present

## 2023-12-31 ENCOUNTER — Ambulatory Visit: Admitting: Occupational Therapy

## 2024-01-02 ENCOUNTER — Ambulatory Visit: Attending: Sports Medicine | Admitting: Occupational Therapy

## 2024-01-02 DIAGNOSIS — M79645 Pain in left finger(s): Secondary | ICD-10-CM | POA: Diagnosis not present

## 2024-01-02 DIAGNOSIS — M6281 Muscle weakness (generalized): Secondary | ICD-10-CM | POA: Insufficient documentation

## 2024-01-02 NOTE — Therapy (Signed)
 OUTPATIENT OCCUPATIONAL THERAPY ORTHO Treatment Note  Patient Name: Steven Kirby MRN: [id number] DOB:1966-07-02, 58 y.o., male Today's Date: 01/02/2024  PCP: Vidal Graven, MD REFERRING PROVIDER: Rance Burrows, MD  END OF SESSION:  OT End of Session - 01/02/24 1104     Visit Number 5    Number of Visits 13    Date for OT Re-Evaluation 01/30/24   asking for 8 weeks to accommodate scheduling   Authorization Type [employer] [id number]    Authorization Time Period Auth#: [id number] , approved 5 OT visits from 12/04/2023-02/01/2024 (30 visits combined OT/PT/ST)    Authorization - Visit Number 5    Authorization - Number of Visits 5    OT Start Time 1102    OT Stop Time 1142    OT Time Calculation (min) 40 min                 Past Medical History:  Diagnosis Date   Allergic rhinitis    Anxiety    Anxiety    Chronic low back pain    Chronic sinusitis    Hemorrhage of rectum and anus    Hemorrhoids    History of MRSA infection    OSA (obstructive sleep apnea)    Past Surgical History:  Procedure Laterality Date   COLONOSCOPY     LUMBAR LAMINECTOMY/DECOMPRESSION MICRODISCECTOMY Right 03/17/2015   Procedure: Right Lumbar Four- Five Far Lateral Diskectomy;  Surgeon: Audie Bleacher, MD;  Location: [facility];  Service: Neurosurgery;  Laterality: Right;  Right L45 far lateral diskectomy   MOLE REMOVAL     RIGHT HEART CATH N/A 05/02/2020   Procedure: RIGHT HEART CATH;  Surgeon: [place], Peter M, MD;  Location: [facility] INVASIVE CV LAB;  Service: Cardiovascular;  Laterality: N/A;   VASECTOMY     Patient Active Problem List   Diagnosis Date Noted   Dyspnea on exertion 05/02/2020   Pre-syncope 11/04/2018   HNP (herniated nucleus pulposus), lumbar 03/17/2015   Obstructive sleep apnea on CPAP 06/07/2013   Obesity (BMI 30-39.9) 06/07/2013    ONSET DATE: referral 11/25/23 (injury Oct 2024)  REFERRING DIAG: N82.95AO (ICD-10-CM) - Unspecified injury of left wrist, hand and finger(s), initial  encounter  THERAPY DIAG:  Thumb pain, left  Muscle weakness (generalized)  Rationale for Evaluation and Treatment: Rehabilitation  SUBJECTIVE:   SUBJECTIVE STATEMENT: Pt reports things are feeling alright, but he has not really been participating in exercises.  Pt reports the pain seems to be more random.  Pt reports feeling that he should f/u with MD regarding additional imaging.   Pt accompanied by: self  PERTINENT HISTORY: h/o blood clots about 1.5 years ago, ~10 years ago ruptured disk; Recurrent sinus infection/allergic rhinitis, Intermittent low back pain, Anxiety, obstructive sleep apnea   PRECAUTIONS: None  RED FLAGS: None   WEIGHT BEARING RESTRICTIONS: No  PAIN:  Are you having pain? No  FALLS: Has patient fallen in last 6 months? Yes. 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  HAND DOMINANCE: Right  ADLs: WFL  FUNCTIONAL OUTCOME MEASURES: Quick Dash: 15.9  01/02/24:   UPPER EXTREMITY ROM:   pain with abduction and thumb opposition to 4th and 5th digit   Active ROM Right eval Left eval  Thumb MCP (0-60)    Thumb IP (0-80) WFL WFL  Thumb Radial abd/add (0-55) WFL  WFL (mild pain)  Thumb Palmar abd/add (0-45) WFL  WFL (mild pain)  Thumb Opposition to Small Finger WFL Mod pain   Index MCP (0-90)     Index PIP  (0-100)     Index DIP (0-70)      Long MCP (0-90)      Long PIP (0-100)      Long DIP (0-70)      Ring MCP (0-90)      Ring PIP (0-100)      Ring DIP (0-70)      Little MCP (0-90)      Little PIP (0-100)      Little DIP (0-70)      (Blank rows = not tested)    HAND FUNCTION: 01/02/24: Left: 84, 84, 72 (average 80#)  Grip strength: Right: 88 lbs; Left: 85 lbs, Lateral pinch: Right: 22 lbs, Left: 18 lbs, and 3 point pinch: Right: 20 lbs, Left: 6 lbs. OF NOTE: Mild pain with L grip, with tendency to attempt grasp without use of thumb yielding 60-65# grip strength.  Painful with 3 point pinch.  COORDINATION: 9 Hole Peg test: Right: 23.09 sec; Left: 23.25 sec - reports mild pain with pinching motion  SENSATION: WFL  COGNITION: Overall cognitive status: Within functional limits for tasks assessed  OBSERVATIONS: Pt with tendency to maintain extension of L thumb with attempts at grip strength, but when incorporating it pt with significant increase in grip strength but also with increase in pain.     TREATMENT DATE:  01/02/24 Grip: Left: 84, 84, 72 (average 80#) lbs.  Pt demonstrating improved consistency and ability to support grasp without increasing pain.   HEP: reiterated engagement either in functional tasks or structured HEP as pt reports decreased pain despite minimal engagement in HEP exercises.   Joint protection strategies: OT reiterating joint protection strategies especially in regards to tasks requiring thumb extension and pinch or pressure.  Pt is able to verbalize some modifications to tasks and awareness of recommendations.  Splint: OT educating and providing picture of Comfort Cool Thumb CMC Restriction Splint and Push Thumb Brace for CMC. OT educating on wear during sleep and/or wear during repetitive activities.  OT measured hand for brace and provided pt with instructions to order either above brace. Self-care: OT encouraged pt to f/u with MD as pt continues to report  pain in [facility] and space between [facility] and MP joint and feeling that imaging not inclusive of entire painful area. Encouraged purchase and wear of splint to assess decrease in pain and overworked joints.     12/22/23 Self-care: pt reports improved ease with picking up feed bags.  Engaged in discussion about typical hand placement when picking feed bags; pt reporting 3 jaw chuck or lateral pinch to hold on to tab at bottom of bag in L hand.  OT reiterated use of 3 jaw chuck and lateral pinch with putty and/or use of chip clips in home for pinch strengthening while attending to onset of pain. ROM: OT reviewed HEP with pt,  removing circumduction at this time due to onset of pain last week.  Reiterated sustained hold with AROM and PROM as well as putty exercises to carry over to work related tasks on the cattle farm. Hand gripper: with LUE on level 55# progressing to 70# resistance.  Pt picked up 1 inch blocks with gripper with 0 drops, however reports increase in pain in thumb with increased resistance and after increased repetition with challenge of increased sustained grasp.   Ultrasound Ultrasound treatment applied to L thumb and into thenar eminence for 8 min to address pain relief and promote blood flow for tissue healing; pt verbally denied contraindications or precautions. No adverse reaction observed or reported after application; skin intact. Parameters: 3.3 MHz, 20% duty cycle, and intensity of 0.8 W/cm2     12/12/23 Self-care: reiterated education on use of moist heat and massage for pain relief and use of heat prior to exercises to increase ROM and decrease pain.   Resistance Clothespins 2,4,6# with LUE for mid functional reaching and sustained pinch.  Completed 3 jaw chuck and lateral pinch with reports of mild pain with increased resistance and with 3 jaw chuck more than lateral pinch.   Putty: engaged in full grasp, 3 jaw chuck, lateral pinch, and thumb MCP flexion with yellow theraputty.  Pt  completing x10+ with each exercise reporting mild increased pain with 3 jaw chuck with larger clump of putty.  OT educating on completion of various grips and pinches to carry over to other functional movements.   Massage: engaged in massage at thenar eminence as pt with reports of increased pain in this area against resistance.  OT providing massage f in clockwise, counter-clockwise, parallel, and perpendicular to the area of tenderness.  OT utilizing lotion to provide increased manipulation.   PATIENT EDUCATION: Education details: edu on heat modalities, massage, and joint protection strategies Person educated: Patient Education method: Explanation, Verbal cues, and Handouts Education comprehension: verbalized understanding and needs further education  HOME EXERCISE PROGRAM: Access Code: [email or web address] URL: [email or web address] Date: 12/10/2023 (updated) Prepared by: [facility] - Outpatient  Rehab - [facility]  Exercises - Thumb AROM Opposition To All Fingers  - 3-4 x daily - 10 reps - Seated Thumb Composite Flexion AROM  - 3-4 x daily - 10 reps - Thumb Webspace Stretch  - 3-4 x daily - 5 reps - 10-15 sec hold - Thumb Strengthening Stabilization CMC  - 3-4 x daily - 5 reps - 5-10 sec hold - Seated Composite Thumb Flexion PROM  - 3-4 x daily - 5 reps - 5-10 hold - Seated Thumb Circumduction AROM  - 3-4 x daily - 5 reps  GOALS: Goals reviewed with patient? Yes  SHORT TERM GOALS: Target date: 01/02/24  Pt will demonstrate and/or report understanding of initial HEP to improve pain levels and prerequisite motion of left thumb. Baseline: new to OPOT Goal status: MET - 01/02/24  2.  Pt will report understanding of joint protection strategies and adaptive strategies to continue to complete work related tasks while not increasing pain > 2/10 on pain scale. Baseline: pain with grasping 50# feed bag Goal status:  NOT MET - pt reports sometimes no pain, but when he does have pain  it is immediate sharp pain > 2/10 increase   LONG TERM GOALS: Target date: 01/30/24  Pt will improve functional ability by decreased impairment per Quick DASH assessment for better quality of life. Baseline: 15.9% 01/02/24: 22.7% Goal status:  IN PROGRESS  2.  Pt  will verbalize decrease in pain and difficulty from mod to min when completing heavy household chores per QuickDASH Baseline: mod on pain and mod on heavy household chores on QuickDASH 01/02/24: mod on pain, however decreased to min on heavy household chores on QuickDASH Goal status:  IN PROGRESS  3.  Pt will improve grip strength in left hand by average of 10 lbs out of 3 attempts for functional use at home and in IADLs. Baseline: 85# x1, however 60-65# x3 01/02/24: Left: 84, 84, 72 (average 80#) Goal status:  MET  4.  Pt will report improved ability to break open cotton pucks (requiring thumb flexion and adduction against resistance). Baseline: painful Goal status:  IN PROGRESS - has not had to complete this task as it is more a fall/winter task   ASSESSMENT:  CLINICAL IMPRESSION: Pt has met 1 STG and 1 LTG at this time.  Pt continues to reports pain in [facility] joint and space between MP J and CMC, especially with composite flexion, thumb extension, and tasks requiring pressure.  Pt to trial splint wear for [facility] support during repetitive activities and rest.  Pt planning to f/u with MD regarding some progress with therapy but continued pain limiting tolerance to aspects of HEP and work related tasks.  Pt would benefit from hold on therapy to allow pt to f/u with MD and then call clinic to schedule additional visits vs d/c at that point.     PERFORMANCE DEFICITS: in functional skills including ADLs, IADLs, ROM, strength, pain, Gross motor control, decreased knowledge of precautions, and UE functional use and psychosocial skills including environmental adaptation and routines and behaviors.   IMPAIRMENTS: are limiting patient from IADLs  and work.   COMORBIDITIES: may have co-morbidities  that affects occupational performance. Patient will benefit from skilled OT to address above impairments and improve overall function.     PLAN:  OT FREQUENCY: 2x/week  OT DURATION: 6 weeks  PLANNED INTERVENTIONS: 97168 OT Re-evaluation, 97535 self care/ADL training, 60454 therapeutic exercise, 97530 therapeutic activity, 97112 neuromuscular re-education, 97140 manual therapy, 97035 ultrasound, 97018 paraffin, 09811 fluidotherapy, 97760 Orthotics management and training, 91478 Splinting (initial encounter), [id number] Subsequent splinting/medication, passive range of motion, compression bandaging, energy conservation, coping strategies training, patient/family education, and DME and/or AE instructions  RECOMMENDED OTHER SERVICES: NA  CONSULTED AND AGREED WITH PLAN OF CARE: Patient  PLAN FOR NEXT SESSION:  Ask for additional visits if needed, insurance only approved 5 visits initially (next visit will be 6 - but did not ask more as pending therapy return based on MD visit)  Review HEP, review massage.  Joint protection strategies.    Future sessions may benefit from ultrasound.  Did you purchase a splint, did it help?     Anthonette Kinsman, OTR/L 01/02/2024, 12:04 PM   [facility] at [facility] [address] [place], [address] [place], [place] [address] Phone # [phone] Fax # [phone]

## 2024-01-05 ENCOUNTER — Ambulatory Visit: Admitting: Occupational Therapy

## 2024-01-07 ENCOUNTER — Ambulatory Visit: Admitting: Occupational Therapy

## 2024-01-12 ENCOUNTER — Encounter: Admitting: Occupational Therapy

## 2024-01-12 DIAGNOSIS — Z20822 Contact with and (suspected) exposure to covid-19: Secondary | ICD-10-CM | POA: Diagnosis not present

## 2024-01-14 ENCOUNTER — Encounter: Admitting: Occupational Therapy

## 2024-01-14 DIAGNOSIS — M79645 Pain in left finger(s): Secondary | ICD-10-CM | POA: Diagnosis not present

## 2024-01-27 DIAGNOSIS — D649 Anemia, unspecified: Secondary | ICD-10-CM | POA: Diagnosis not present

## 2024-02-02 DIAGNOSIS — D509 Iron deficiency anemia, unspecified: Secondary | ICD-10-CM | POA: Diagnosis not present

## 2024-02-09 DIAGNOSIS — D509 Iron deficiency anemia, unspecified: Secondary | ICD-10-CM | POA: Diagnosis not present

## 2024-02-11 DIAGNOSIS — R3914 Feeling of incomplete bladder emptying: Secondary | ICD-10-CM | POA: Diagnosis not present

## 2024-02-11 DIAGNOSIS — R350 Frequency of micturition: Secondary | ICD-10-CM | POA: Diagnosis not present

## 2024-02-11 DIAGNOSIS — N5311 Retarded ejaculation: Secondary | ICD-10-CM | POA: Diagnosis not present

## 2024-02-11 DIAGNOSIS — N401 Enlarged prostate with lower urinary tract symptoms: Secondary | ICD-10-CM | POA: Diagnosis not present

## 2024-02-13 DIAGNOSIS — K449 Diaphragmatic hernia without obstruction or gangrene: Secondary | ICD-10-CM | POA: Diagnosis not present

## 2024-02-13 DIAGNOSIS — K621 Rectal polyp: Secondary | ICD-10-CM | POA: Diagnosis not present

## 2024-02-13 DIAGNOSIS — D509 Iron deficiency anemia, unspecified: Secondary | ICD-10-CM | POA: Diagnosis not present

## 2024-02-13 DIAGNOSIS — K635 Polyp of colon: Secondary | ICD-10-CM | POA: Diagnosis not present

## 2024-02-13 DIAGNOSIS — Z860100 Personal history of colon polyps, unspecified: Secondary | ICD-10-CM | POA: Diagnosis not present

## 2024-02-13 DIAGNOSIS — Z09 Encounter for follow-up examination after completed treatment for conditions other than malignant neoplasm: Secondary | ICD-10-CM | POA: Diagnosis not present

## 2024-02-13 DIAGNOSIS — K319 Disease of stomach and duodenum, unspecified: Secondary | ICD-10-CM | POA: Diagnosis not present

## 2024-02-13 DIAGNOSIS — K2970 Gastritis, unspecified, without bleeding: Secondary | ICD-10-CM | POA: Diagnosis not present

## 2024-02-26 DIAGNOSIS — G4733 Obstructive sleep apnea (adult) (pediatric): Secondary | ICD-10-CM | POA: Diagnosis not present

## 2024-02-26 DIAGNOSIS — R790 Abnormal level of blood mineral: Secondary | ICD-10-CM | POA: Diagnosis not present

## 2024-02-26 DIAGNOSIS — R0600 Dyspnea, unspecified: Secondary | ICD-10-CM | POA: Diagnosis not present

## 2024-02-26 DIAGNOSIS — Z23 Encounter for immunization: Secondary | ICD-10-CM | POA: Diagnosis not present

## 2024-02-26 DIAGNOSIS — E611 Iron deficiency: Secondary | ICD-10-CM | POA: Diagnosis not present

## 2024-02-26 DIAGNOSIS — Z125 Encounter for screening for malignant neoplasm of prostate: Secondary | ICD-10-CM | POA: Diagnosis not present

## 2024-02-26 DIAGNOSIS — Z0000 Encounter for general adult medical examination without abnormal findings: Secondary | ICD-10-CM | POA: Diagnosis not present

## 2024-02-26 DIAGNOSIS — E7800 Pure hypercholesterolemia, unspecified: Secondary | ICD-10-CM | POA: Diagnosis not present

## 2024-04-06 DIAGNOSIS — G4733 Obstructive sleep apnea (adult) (pediatric): Secondary | ICD-10-CM | POA: Diagnosis not present

## 2024-04-27 DIAGNOSIS — G4733 Obstructive sleep apnea (adult) (pediatric): Secondary | ICD-10-CM | POA: Diagnosis not present

## 2024-05-04 NOTE — Progress Notes (Signed)
 [facility] [phone]   Fax:([phone]  CONSULT NOTE  REFERRING PHYSICIAN: Dr. Seabron  REASON FOR CONSULTATION:  IDA  HPI Darroll Bredeson is a 59 y.o. male with a past medical history is significant for OSA, herniated nucleus, and melanoma is referred to the clinic for IDA.   In summary the patient had lab work performed at his PCPs office April 2025 which showed mild microcytic anemia with a hemoglobin of 12.4 and his iron studies revealed iron deficiency with a ferritin of 9.7 low saturation at 10% and low iron at 41.  He was started on ferrous sulfate which she had intolerance to.  He even tried taking slow release iron and despite taking fiber and drinking plenty of water the patient continued to have constipation.   He was referred to GI. He saw Dr. Dianna and had upper endoscopy and colonoscopy on 02/09/24 which showed gastritis and two benign polyps. He is not on a PPI. He also had some hemorrhoids. A capsule endoscopy was considered but decided to hold off for now as the hemorrhoids could have contributed. There was also an out of pocket cost, although the patient states if it was felt to be needed they would pay for it.   His iron was rechecked in June 2025 and his iron improved to 110. His Hbg improved to 14.2. His ferritin improved but was still below the reference range at 19.2. Therefore he was referred to the clinic.  I do not see any prior records earlier than April 2025 but the patient and his wife report that he has had some anemia over the last year.  The last CBC through [facility] is from 2021.  Of note the patient is an avid blood donor.  He has donated his blood 64 times throughout his life.  He has been donating blood for 30 years reportedly every 2 months or so give or take.  He donated blood twice this year.  He believes the most recent blood donation was in May or June.  He has since stopped donating blood due to the anemia.   Overall, the  patient does have some weakness and decreased exercise tolerance/shortness of breath.  The patient was found to have moderate sleep apnea and is expected to start using a CPAP in the near future.  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  He eats red meat daily.  He also has fatigue especially during the daytime.  He denies any visible bleeding such as epistaxis, gingival bleeding, hemoptysis, hematemesis, melena, or hematochezia.  He has never required a blood transfusion or iron infusion. He denies any other vitamin deficiencies to his knowledge.  He does not take ibuprofen frequently. He is not on any blood thinners/aspirin.  He does crave ice chips.  He denies any history of bariatric surgery.    His paternal grandmother had colorectal cancer and was diagnosed at the age of 95.  Both his parents are living and in fairly good health.  The patient's father has melanoma, several orthopedic surgeries, and hypertension.   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  He is recently married.  He has children.  He denies any smoking, alcohol, or drug use.     HPI  Past Medical History:  Diagnosis Date   Allergic rhinitis    Anxiety  Anxiety    Chronic low back pain    Chronic sinusitis    Hemorrhage of rectum and anus    Hemorrhoids    History of MRSA infection    OSA (obstructive sleep apnea)     Past Surgical History:  Procedure Laterality Date   COLONOSCOPY     LUMBAR LAMINECTOMY/DECOMPRESSION MICRODISCECTOMY Right 03/17/2015   Procedure: Right Lumbar Four- Five Far Lateral Diskectomy;  Surgeon: Rockey Peru, MD;  Location: [facility];  Service: Neurosurgery;  Laterality: Right;  Right L45 far lateral diskectomy   MOLE REMOVAL     RIGHT HEART CATH N/A 05/02/2020   Procedure: RIGHT HEART CATH;  Surgeon: Swaziland, Peter M, MD;  Location: [facility] INVASIVE CV LAB;  Service:  Cardiovascular;  Laterality: N/A;   VASECTOMY      Family History  Problem Relation Age of Onset   Hypertension Father    Diverticulitis Other        without polys   Hypertension Other    Hyperlipidemia Other    Colon cancer Other     Social History Social History   Tobacco Use   Smoking status: Never   Smokeless tobacco: Never  Vaping Use   Vaping status: Never Used  Substance Use Topics   Alcohol use: No   Drug use: No    No Known Allergies  No current outpatient medications on file.   Current Facility-Administered Medications  Medication Dose Route Frequency Provider Last Rate Last Admin   sodium chloride  flush (NS) 0.9 % injection 3 mL  3 mL Intravenous Q12H Swaziland, Peter M, MD        REVIEW OF SYSTEMS:   Review of Systems  Constitutional: Positive for fatigue. Negative for appetite change, chills, fever and unexpected weight change.  HENT: Negative for mouth sores, nosebleeds, sore throat and trouble swallowing.   Eyes: Negative for eye problems and icterus.  Respiratory: Positive for dyspnea with exertion. Negative for cough, hemoptysis,  and wheezing.   Cardiovascular: Negative for chest pain and leg swelling.  Gastrointestinal: Negative for abdominal pain, constipation, diarrhea, nausea and vomiting.  Genitourinary: Negative for bladder incontinence, difficulty urinating, dysuria, frequency and hematuria.   Musculoskeletal: Negative for back pain, gait problem, neck pain and neck stiffness.  Skin: Negative for itching and rash.  Neurological: Negative for dizziness, extremity weakness, gait problem, headaches, light-headedness and seizures.  Hematological: Negative for adenopathy. Does not bruise/bleed easily.  Psychiatric/Behavioral: Negative for confusion, depression and sleep disturbance. The patient is not nervous/anxious.     PHYSICAL EXAMINATION:  Blood pressure (!) 139/93, pulse 63, temperature (!) 97.3 F (36.3 C), resp. rate 17, weight 203 lb  14.4 oz (92.5 kg), SpO2 100%.  ECOG PERFORMANCE STATUS: 1  Physical Exam  Constitutional: Oriented to person, place, and time and well-developed, well-nourished, and in no distress.  HENT:  Head: Normocephalic and atraumatic.  Mouth/Throat: Oropharynx is clear and moist. No oropharyngeal exudate.  Eyes: Conjunctivae are normal. Right eye exhibits no discharge. Left eye exhibits no discharge. No scleral icterus.  Neck: Normal range of motion. Neck supple.  Cardiovascular: Normal rate, regular rhythm, normal heart sounds and intact distal pulses.   Pulmonary/Chest: Effort normal and breath sounds normal. No respiratory distress. No wheezes. No rales.  Abdominal: Soft. Bowel sounds are normal. Exhibits no distension and no mass. There is no tenderness.  Musculoskeletal: Normal range of motion. Exhibits no edema.  Lymphadenopathy:    No cervical adenopathy.  Neurological: Alert and oriented to person, place, and time.  Exhibits normal muscle tone. Gait normal. Coordination normal.  Skin: Skin is warm and dry. No rash noted. Not diaphoretic. No erythema. No pallor.  Psychiatric: Mood, memory and judgment normal.  Vitals reviewed.  LABORATORY DATA: Lab Results  Component Value Date   WBC 6.2 04/27/2020   HGB 15.0 05/02/2020   HCT 44.0 05/02/2020   MCV 83 04/27/2020   PLT 305 04/27/2020      Chemistry      Component Value Date/Time   NA 141 05/02/2020 1117   NA 142 04/27/2020 1215   K 4.1 05/02/2020 1117   CL 103 04/27/2020 1215   CO2 27 04/27/2020 1215   BUN 15 04/27/2020 1215   CREATININE 1.12 04/27/2020 1215      Component Value Date/Time   CALCIUM 10.0 04/27/2020 1215       RADIOGRAPHIC STUDIES: No results found.  ASSESSMENT: The patient is a pleasant 58 year old male with iron deficiency anemia (IDA). His recent endoscopy revealed gastritis, which may contribute to impaired iron absorption. He is also a frequent blood donor, which is likely a significant contributing  factor to his anemia.     Plan: #Iron Deficiency Anemia: - The patient was evaluated by Dr. Federico, who discussed that the IDA is likely multifactorial: primarily from chronic blood donation and malabsorption due to gastritis. - He has been advised to refrain from further blood donation for the time being. - He reports intolerance to oral iron supplements due to constipation. - Laboratory Workup: The following labs have been ordered today to assess the current status of his anemia: CBC, CMP, Iron studies, Ferritin, Vitamin B12, Folate, Methylmalonic acid, Reticulocyte count - I will contact Gastroenterology to determine whether a proton pump inhibitor (PPI) is indicated for his gastritis.  - If lab results confirm continued iron deficiency, we will arrange for IV iron therapy at the [facility] infusion center, with the number of doses (1-5) based on insurance coverage of preferred iron - If labs show improvement in his iron deficiency, we will plan to repeat labs in 3 months for ongoing monitoring. -If an iron infusion is given, we will recheck labs 4-6 weeks post-infusion and  schedule a follow-up visit 1 week after repeat labs - The patient was provided with a handout on iron-rich foods, although he already follows a well-balanced diet. - I will contact him next week with lab results either via phone or through MyChart message.  The patient voices understanding of current disease status and treatment options and is in agreement with the current care plan.  All questions were answered. The patient knows to call the clinic with any problems, questions or concerns. We can certainly see the patient much sooner if necessary.  Thank you so much for allowing me to participate in the care of Sanders Manninen. I will continue to follow up the patient with you and assist in his care.   Disclaimer: This note was dictated with voice recognition software. Similar sounding words can inadvertently be  transcribed and may not be corrected upon review.   Nixon Kolton L Le Faulcon May 07, 2024, 9:37 AM   I have read the above note and personally examined the patient. I agree with the assessment and plan as noted above.  Briefly Mr. Cinch Ormond is a 58 year old male who presents for evaluation of iron deficiency anemia.  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  He reports that his  energy levels have been good and he does have some occasional issues with leg cramps.  He notes that he is not having any lightheadedness, dizziness, or shortness of breath.  He has been taking p.o. iron supplementation and tolerating well without any major GI upset.  At this time would recommend ordering a full iron panel with ferritin, reticulocyte panel, iron binding, and vitamin B12.  If the patient is found to have persistent iron deficiency would recommend pursuing an IV iron infusion.  Additionally recommend the patient follow-up with gastroenterology for possible GI blood loss (he has undergone EGD and colonoscopy which revealed gastritis but no other overt sources of bleeding).  Capsule anoscopy could be considered if he has persistent iron deficiency/iron loss.  The patient voiced understanding of our findings and recommendations moving forward.   Norleen IVAR Kidney, MD Department of Hematology/Oncology [facility] Phone: [phone] Pager: [phone] Email: [email or web address]

## 2024-05-07 ENCOUNTER — Inpatient Hospital Stay

## 2024-05-07 ENCOUNTER — Encounter: Payer: Self-pay | Admitting: Physician Assistant

## 2024-05-07 ENCOUNTER — Other Ambulatory Visit: Payer: Self-pay | Admitting: Physician Assistant

## 2024-05-07 ENCOUNTER — Inpatient Hospital Stay: Attending: Physician Assistant | Admitting: Physician Assistant

## 2024-05-07 ENCOUNTER — Telehealth: Payer: Self-pay | Admitting: Physician Assistant

## 2024-05-07 VITALS — BP 139/93 | HR 63 | Temp 97.3°F | Resp 17 | Wt 203.9 lb

## 2024-05-07 DIAGNOSIS — K2970 Gastritis, unspecified, without bleeding: Secondary | ICD-10-CM | POA: Insufficient documentation

## 2024-05-07 DIAGNOSIS — Z800 Family history of malignant neoplasm of digestive organs: Secondary | ICD-10-CM | POA: Insufficient documentation

## 2024-05-07 DIAGNOSIS — Z808 Family history of malignant neoplasm of other organs or systems: Secondary | ICD-10-CM | POA: Diagnosis not present

## 2024-05-07 DIAGNOSIS — D509 Iron deficiency anemia, unspecified: Secondary | ICD-10-CM

## 2024-05-07 LAB — CBC WITH DIFFERENTIAL (CANCER CENTER ONLY)
Abs Immature Granulocytes: 0.01 K/uL (ref 0.00–0.07)
Basophils Absolute: 0.1 K/uL (ref 0.0–0.1)
Basophils Relative: 1 %
Eosinophils Absolute: 0.1 K/uL (ref 0.0–0.5)
Eosinophils Relative: 2 %
HCT: 44.4 % (ref 39.0–52.0)
Hemoglobin: 15 g/dL (ref 13.0–17.0)
Immature Granulocytes: 0 %
Lymphocytes Relative: 39 %
Lymphs Abs: 2.3 K/uL (ref 0.7–4.0)
MCH: 27.3 pg (ref 26.0–34.0)
MCHC: 33.8 g/dL (ref 30.0–36.0)
MCV: 80.9 fL (ref 80.0–100.0)
Monocytes Absolute: 0.3 K/uL (ref 0.1–1.0)
Monocytes Relative: 6 %
Neutro Abs: 3.1 K/uL (ref 1.7–7.7)
Neutrophils Relative %: 52 %
Platelet Count: 282 K/uL (ref 150–400)
RBC: 5.49 MIL/uL (ref 4.22–5.81)
RDW: 13.9 % (ref 11.5–15.5)
WBC Count: 5.9 K/uL (ref 4.0–10.5)
nRBC: 0 % (ref 0.0–0.2)

## 2024-05-07 LAB — IRON AND IRON BINDING CAPACITY (CC-WL,HP ONLY)
Iron: 101 ug/dL (ref 45–182)
Saturation Ratios: 29 % (ref 17.9–39.5)
TIBC: 351 ug/dL (ref 250–450)
UIBC: 250 ug/dL (ref 117–376)

## 2024-05-07 LAB — RETIC PANEL
Immature Retic Fract: 7.2 % (ref 2.3–15.9)
RBC.: 5.44 MIL/uL (ref 4.22–5.81)
Retic Count, Absolute: 53.9 K/uL (ref 19.0–186.0)
Retic Ct Pct: 1 % (ref 0.4–3.1)
Reticulocyte Hemoglobin: 31 pg (ref >27.9)

## 2024-05-07 LAB — CMP (CANCER CENTER ONLY)
ALT: 15 U/L (ref 0–44)
AST: 20 U/L (ref 15–41)
Albumin: 4.3 g/dL (ref 3.5–5.0)
Alkaline Phosphatase: 81 U/L (ref 38–126)
Anion gap: 5 (ref 5–15)
BUN: 17 mg/dL (ref 6–20)
CO2: 29 mmol/L (ref 22–32)
Calcium: 9.4 mg/dL (ref 8.9–10.3)
Chloride: 105 mmol/L (ref 98–111)
Creatinine: 1.08 mg/dL (ref 0.61–1.24)
GFR, Estimated: >60 mL/min (ref >60)
Glucose, Bld: 91 mg/dL (ref 70–99)
Potassium: 4.4 mmol/L (ref 3.5–5.1)
Sodium: 139 mmol/L (ref 135–145)
Total Bilirubin: 0.6 mg/dL (ref 0.0–1.2)
Total Protein: 8 g/dL (ref 6.5–8.1)

## 2024-05-07 LAB — FERRITIN: Ferritin: 71 ng/mL (ref 24–336)

## 2024-05-07 LAB — FOLATE: Folate: 11.2 ng/mL (ref >5.9)

## 2024-05-07 LAB — VITAMIN B12: Vitamin B-12: 542 pg/mL (ref 180–914)

## 2024-05-07 NOTE — Telephone Encounter (Signed)
 I attempted to call but was unable to reach him. His iron labs have improved. No IV iron needed at this time. Will continue to monitor with labs in 3 months and follow up the following week. I left our call back number if he has any questions and will also send mychart message as well.

## 2024-05-11 LAB — METHYLMALONIC ACID, SERUM: Methylmalonic Acid, Quantitative: 392 nmol/L — ABNORMAL HIGH (ref 0–378)

## 2024-05-12 ENCOUNTER — Other Ambulatory Visit: Payer: Self-pay | Admitting: Physician Assistant

## 2024-05-12 ENCOUNTER — Telehealth: Payer: Self-pay | Admitting: Physician Assistant

## 2024-05-12 DIAGNOSIS — D509 Iron deficiency anemia, unspecified: Secondary | ICD-10-CM

## 2024-05-12 DIAGNOSIS — Z3141 Encounter for fertility testing: Secondary | ICD-10-CM | POA: Diagnosis not present

## 2024-05-12 NOTE — Telephone Encounter (Signed)
 I called the patient to give him a summary of his lab results.  His labs came back normal except his methylmalonic acid is slightly elevated.  The patient did a good job correcting his iron through dietary changes.  He was encouraged to continue to have a well-balanced diet.  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  He is wondering what B12 does in the body reviewed that it is important for nervous some function.  He also had some questions about leg cramps and he was advised to ensure he is hydrating well at home.  He also states that he was thinking about starting a multivitamin.  Will see him in 3 months.  He would like for his B12/MMA to be rechecked at that time in addition to his CBC and iron studies.

## 2024-06-11 ENCOUNTER — Other Ambulatory Visit: Payer: Self-pay | Admitting: Medical Genetics

## 2024-06-15 DIAGNOSIS — D509 Iron deficiency anemia, unspecified: Secondary | ICD-10-CM | POA: Diagnosis not present

## 2024-06-23 DIAGNOSIS — R208 Other disturbances of skin sensation: Secondary | ICD-10-CM | POA: Diagnosis not present

## 2024-06-23 DIAGNOSIS — Z789 Other specified health status: Secondary | ICD-10-CM | POA: Diagnosis not present

## 2024-06-23 DIAGNOSIS — D1801 Hemangioma of skin and subcutaneous tissue: Secondary | ICD-10-CM | POA: Diagnosis not present

## 2024-06-23 DIAGNOSIS — L814 Other melanin hyperpigmentation: Secondary | ICD-10-CM | POA: Diagnosis not present

## 2024-06-23 DIAGNOSIS — L820 Inflamed seborrheic keratosis: Secondary | ICD-10-CM | POA: Diagnosis not present

## 2024-06-23 DIAGNOSIS — M71341 Other bursal cyst, right hand: Secondary | ICD-10-CM | POA: Diagnosis not present

## 2024-06-23 DIAGNOSIS — L570 Actinic keratosis: Secondary | ICD-10-CM | POA: Diagnosis not present

## 2024-06-23 DIAGNOSIS — L821 Other seborrheic keratosis: Secondary | ICD-10-CM | POA: Diagnosis not present

## 2024-06-23 DIAGNOSIS — L538 Other specified erythematous conditions: Secondary | ICD-10-CM | POA: Diagnosis not present

## 2024-06-24 ENCOUNTER — Other Ambulatory Visit (HOSPITAL_COMMUNITY)
Admission: RE | Admit: 2024-06-24 | Discharge: 2024-06-24 | Disposition: A | Discharge status: Home / Self Care | Payer: Self-pay | Source: Ambulatory Visit | Attending: Medical Genetics | Admitting: Medical Genetics

## 2024-07-02 LAB — GENECONNECT MOLECULAR SCREEN: Genetic Analysis Overall Interpretation: NEGATIVE

## 2024-07-21 DIAGNOSIS — H2513 Age-related nuclear cataract, bilateral: Secondary | ICD-10-CM | POA: Diagnosis not present

## 2024-07-21 DIAGNOSIS — H11153 Pinguecula, bilateral: Secondary | ICD-10-CM | POA: Diagnosis not present

## 2024-07-21 DIAGNOSIS — H40013 Open angle with borderline findings, low risk, bilateral: Secondary | ICD-10-CM | POA: Diagnosis not present

## 2024-07-22 DIAGNOSIS — J019 Acute sinusitis, unspecified: Secondary | ICD-10-CM | POA: Diagnosis not present

## 2024-07-23 DIAGNOSIS — G4733 Obstructive sleep apnea (adult) (pediatric): Secondary | ICD-10-CM | POA: Diagnosis not present

## 2024-08-06 ENCOUNTER — Inpatient Hospital Stay: Attending: Physician Assistant

## 2024-08-06 DIAGNOSIS — K2970 Gastritis, unspecified, without bleeding: Secondary | ICD-10-CM | POA: Insufficient documentation

## 2024-08-06 DIAGNOSIS — D509 Iron deficiency anemia, unspecified: Secondary | ICD-10-CM | POA: Insufficient documentation

## 2024-08-06 DIAGNOSIS — E538 Deficiency of other specified B group vitamins: Secondary | ICD-10-CM | POA: Insufficient documentation

## 2024-08-06 DIAGNOSIS — Z800 Family history of malignant neoplasm of digestive organs: Secondary | ICD-10-CM | POA: Diagnosis not present

## 2024-08-06 LAB — CMP (CANCER CENTER ONLY)
ALT: 25 U/L (ref 0–44)
AST: 31 U/L (ref 15–41)
Albumin: 4.4 g/dL (ref 3.5–5.0)
Alkaline Phosphatase: 91 U/L (ref 38–126)
Anion gap: 8 (ref 5–15)
BUN: 11 mg/dL (ref 6–20)
CO2: 29 mmol/L (ref 22–32)
Calcium: 9.6 mg/dL (ref 8.9–10.3)
Chloride: 104 mmol/L (ref 98–111)
Creatinine: 1.14 mg/dL (ref 0.61–1.24)
GFR, Estimated: >60 mL/min (ref >60)
Glucose, Bld: 89 mg/dL (ref 70–99)
Potassium: 4.8 mmol/L (ref 3.5–5.1)
Sodium: 140 mmol/L (ref 135–145)
Total Bilirubin: 0.5 mg/dL (ref 0.0–1.2)
Total Protein: 8 g/dL (ref 6.5–8.1)

## 2024-08-06 LAB — CBC WITH DIFFERENTIAL (CANCER CENTER ONLY)
Abs Immature Granulocytes: 0.02 K/uL (ref 0.00–0.07)
Basophils Absolute: 0.1 K/uL (ref 0.0–0.1)
Basophils Relative: 1 %
Eosinophils Absolute: 0.1 K/uL (ref 0.0–0.5)
Eosinophils Relative: 2 %
HCT: 43.3 % (ref 39.0–52.0)
Hemoglobin: 14.5 g/dL (ref 13.0–17.0)
Immature Granulocytes: 0 %
Lymphocytes Relative: 40 %
Lymphs Abs: 2.7 K/uL (ref 0.7–4.0)
MCH: 27.9 pg (ref 26.0–34.0)
MCHC: 33.5 g/dL (ref 30.0–36.0)
MCV: 83.3 fL (ref 80.0–100.0)
Monocytes Absolute: 0.5 K/uL (ref 0.1–1.0)
Monocytes Relative: 7 %
Neutro Abs: 3.5 K/uL (ref 1.7–7.7)
Neutrophils Relative %: 50 %
Platelet Count: 306 K/uL (ref 150–400)
RBC: 5.2 MIL/uL (ref 4.22–5.81)
RDW: 12.9 % (ref 11.5–15.5)
WBC Count: 6.9 K/uL (ref 4.0–10.5)
nRBC: 0 % (ref 0.0–0.2)

## 2024-08-06 LAB — IRON AND IRON BINDING CAPACITY (CC-WL,HP ONLY)
Iron: 89 ug/dL (ref 45–182)
Saturation Ratios: 26 % (ref 17.9–39.5)
TIBC: 340 ug/dL (ref 250–450)
UIBC: 251 ug/dL

## 2024-08-06 LAB — FERRITIN: Ferritin: 88 ng/mL (ref 24–336)

## 2024-08-06 LAB — VITAMIN B12: Vitamin B-12: 736 pg/mL (ref 180–914)

## 2024-08-10 LAB — METHYLMALONIC ACID, SERUM: Methylmalonic Acid, Quantitative: 342 nmol/L (ref 0–378)

## 2024-08-13 ENCOUNTER — Ambulatory Visit: Admitting: Hematology and Oncology

## 2024-08-13 VITALS — BP 127/79 | HR 61 | Temp 98.4°F | Resp 13 | Wt 205.9 lb

## 2024-08-13 DIAGNOSIS — D509 Iron deficiency anemia, unspecified: Secondary | ICD-10-CM | POA: Diagnosis not present

## 2024-08-13 NOTE — Progress Notes (Signed)
 [facility] Telephone:([phone]   Fax:([phone]  PROGRESS NOTE  Patient Care Team: Gib Charleston, MD as PCP - General (Family Medicine) Livingston Rigg, MD as Consulting Physician (Dermatology)  Hematological/Oncological History # Iron Deficiency Anemia/ Vitamin B12 Deficiency  Interval History:  Steven Kirby 58 y.o. male with medical history significant for iron deficiency and vitamin B12 deficiency who presents for a follow up visit. The patient's last visit was on 05/07/2024. In the interim since the last visit his nutritional levels have been improving through diet without any additional supplemental medication.  On exam today Steven Kirby reports that he feels well.  He notes that he has not been taking any medication to help with the gastritis.  He reports he tried to eat red meat more about 3 times per week.  He reports his energy levels are good at about a 6 out of 10.  He notes he is not having any lightheadedness, dizziness, shortness of breath.  He denies any bleeding, bruising, or dark stools.  He reports that he did recently have a sinus infection about 1 month ago and that he is prone to 1-2 sinus infections per year.  His appetite remains good.  He is also not recently given any blood.  He reports that he would like to go back to donating at least 2-3 times per year.  Overall he feels well and has no additional questions concerns or complaints.  A full 10 point ROS is otherwise negative.  At this time his findings do appear most consistent with iron deficiency anemia due to blood donations as well as decreased absorption of iron with gastritis.  He is eating a healthy diet of iron rich foods and would like to proceed with donations again.  Would recommend he continue to follow with his primary care provider and if his iron levels were to drop recommend discontinuing blood donations altogether.  The patient voiced understanding of our findings and recommendations  moving forward.  MEDICAL HISTORY:  Past Medical History:  Diagnosis Date   Allergic rhinitis    Anxiety    Anxiety    Chronic low back pain    Chronic sinusitis    Hemorrhage of rectum and anus    Hemorrhoids    History of MRSA infection    OSA (obstructive sleep apnea)     SURGICAL HISTORY: Past Surgical History:  Procedure Laterality Date   COLONOSCOPY     LUMBAR LAMINECTOMY/DECOMPRESSION MICRODISCECTOMY Right 03/17/2015   Procedure: Right Lumbar Four- Five Far Lateral Diskectomy;  Surgeon: Rockey Peru, MD;  Location: [facility];  Service: Neurosurgery;  Laterality: Right;  Right L45 far lateral diskectomy   MOLE REMOVAL     RIGHT HEART CATH N/A 05/02/2020   Procedure: RIGHT HEART CATH;  Surgeon: Jordan, Peter M, MD;  Location: [facility] INVASIVE CV LAB;  Service: Cardiovascular;  Laterality: N/A;   VASECTOMY      SOCIAL HISTORY: Social History   Socioeconomic History   Marital status: Married    Spouse name: Not on file   Number of children: Not on file   Years of education: Not on file   Highest education level: Not on file  Occupational History   Not on file  Tobacco Use   Smoking status: Never   Smokeless tobacco: Never  Vaping Use   Vaping status: Never Used  Substance and Sexual Activity   Alcohol use: No   Drug use: No   Sexual activity: Not on file  Other Topics Concern   Not on file  Social History Narrative   Not on file   Social Drivers of Health   Tobacco Use: Low Risk (01/12/2024)   Received from [facility]   Patient History    Smoking Tobacco Use: Never    Smokeless Tobacco Use: Never    Passive Exposure: Never  Financial Resource Strain: Not on file  Food Insecurity: No Food Insecurity (05/07/2024)   Epic    Worried About Programme Researcher, Broadcasting/film/video in the Last Year: Never true    Ran Out of Food in the Last Year: Never true  Transportation Needs: No Transportation Needs (05/07/2024)   Epic    Lack of Transportation (Medical): No    Lack of  Transportation (Non-Medical): No  Physical Activity: Not on file  Stress: Not on file  Social Connections: Unknown (01/14/2022)   Received from [facility]   Social Network    Social Network: Not on file  Intimate Partner Violence: Not At Risk (05/07/2024)   Epic    Fear of Current or Ex-Partner: No    Emotionally Abused: No    Physically Abused: No    Sexually Abused: No  Depression (PHQ2-9): Low Risk (05/07/2024)   Depression (PHQ2-9)    PHQ-2 Score: 0  Alcohol Screen: Not on file  Housing: Low Risk (05/07/2024)   Epic    Unable to Pay for Housing in the Last Year: No    Number of Times Moved in the Last Year: 0    Homeless in the Last Year: No  Utilities: Not At Risk (05/07/2024)   Epic    Threatened with loss of utilities: No  Health Literacy: Not on file    FAMILY HISTORY: Family History  Problem Relation Age of Onset   Hypertension Father    Diverticulitis Other        without polys   Hypertension Other    Hyperlipidemia Other    Colon cancer Other     ALLERGIES:  has no known allergies.  MEDICATIONS:  No current outpatient medications on file.   Current Facility-Administered Medications  Medication Dose Route Frequency Provider Last Rate Last Admin   sodium chloride  flush (NS) 0.9 % injection 3 mL  3 mL Intravenous Q12H Jordan, Peter M, MD        REVIEW OF SYSTEMS:   Constitutional: ( - ) fevers, ( - )  chills , ( - ) night sweats Eyes: ( - ) blurriness of vision, ( - ) double vision, ( - ) watery eyes Ears, nose, mouth, throat, and face: ( - ) mucositis, ( - ) sore throat Respiratory: ( - ) cough, ( - ) dyspnea, ( - ) wheezes Cardiovascular: ( - ) palpitation, ( - ) chest discomfort, ( - ) lower extremity swelling Gastrointestinal:  ( - ) nausea, ( - ) heartburn, ( - ) change in bowel habits Skin: ( - ) abnormal skin rashes Lymphatics: ( - ) new lymphadenopathy, ( - ) easy bruising Neurological: ( - ) numbness, ( - ) tingling, ( - ) new  weaknesses Behavioral/Psych: ( - ) mood change, ( - ) new changes  All other systems were reviewed with the patient and are negative.  PHYSICAL EXAMINATION: Vitals:   08/13/24 1118  BP: 127/79  Pulse: 61  Resp: 13  Temp: 98.4 F (36.9 C)  SpO2: 99%   Filed Weights   08/13/24 1118  Weight: 205 lb 14.4 oz (93.4 kg)  GENERAL: Well-appearing middle-aged Caucasian male alert, no distress and comfortable SKIN: skin color, texture, turgor are normal, no rashes or significant lesions EYES: conjunctiva are pink and non-injected, sclera clear LUNGS: clear to auscultation and percussion with normal breathing effort HEART: regular rate & rhythm and no murmurs and no lower extremity edema Musculoskeletal: no cyanosis of digits and no clubbing  PSYCH: alert & oriented x 3, fluent speech NEURO: no focal motor/sensory deficits  LABORATORY DATA:  I have reviewed the data as listed    Latest Ref Rng & Units 08/06/2024   11:05 AM 05/07/2024    9:33 AM 05/02/2020   11:17 AM  CBC  WBC 4.0 - 10.5 K/uL 6.9  5.9    Hemoglobin 13.0 - 17.0 g/dL 85.4  84.9  84.9   Hematocrit 39.0 - 52.0 % 43.3  44.4  44.0   Platelets 150 - 400 K/uL 306  282         Latest Ref Rng & Units 08/06/2024   11:05 AM 05/07/2024    9:33 AM 05/02/2020   11:17 AM  CMP  Glucose 70 - 99 mg/dL 89  91    BUN 6 - 20 mg/dL 11  17    Creatinine 9.38 - 1.24 mg/dL 8.85  8.91    Sodium 864 - 145 mmol/L 140  139  141   Potassium 3.5 - 5.1 mmol/L 4.8  4.4  4.1   Chloride 98 - 111 mmol/L 104  105    CO2 22 - 32 mmol/L 29  29    Calcium 8.9 - 10.3 mg/dL 9.6  9.4    Total Protein 6.5 - 8.1 g/dL 8.0  8.0    Total Bilirubin 0.0 - 1.2 mg/dL 0.5  0.6    Alkaline Phos 38 - 126 U/L 91  81    AST 15 - 41 U/L 31  20    ALT 0 - 44 U/L 25  15      RADIOGRAPHIC STUDIES: No results found.  ASSESSMENT & PLAN Steven Kirby 58 y.o. male with medical history significant for iron deficiency and vitamin B12 deficiency who presents for a  follow up visit.  # Iron Deficiency Anemia 2/2 to Blood Donation/Gastritis # Vitamin b12 deficiency -- Patient's nutritional levels appear to have corrected with diet alone.  He does not require any supplemental nutritional pills -- Patient reports he is feeling well and has no complaints or concerns today. -- Labs today are strong with white blood cell 6.9, hemoglobin 14.5, MCV 83.3, platelets 306.  Methylmalonic acid send normalized at 342 with ferritin of 88 -- Recommend patient continue to follow with his primary care provider and donate blood sparingly.  If he would become iron deficient again recommend discontinuation of donations -- Return to clinic on an as-needed basis  No orders of the defined types were placed in this encounter.   All questions were answered. The patient knows to call the clinic with any problems, questions or concerns.  A total of more than 30 minutes were spent on this encounter with face-to-face time and non-face-to-face time, including preparing to see the patient, ordering tests and/or medications, counseling the patient and coordination of care as outlined above.   Norleen IVAR Kidney, MD Department of Hematology/Oncology [facility] Phone: [phone] Pager: [phone] Email: [email or web address]  08/15/2024 3:18 PM
